# Patient Record
Sex: Female | Born: 1965 | Race: Black or African American | Hispanic: No | Marital: Single | Smoking: Light tobacco smoker
Health system: Southern US, Community
[De-identification: ages and names within clinical notes are randomized; demographics above are authoritative.]

## PROBLEM LIST (undated history)

## (undated) DIAGNOSIS — E162 Hypoglycemia, unspecified: Secondary | ICD-10-CM

## (undated) HISTORY — PX: ROTATOR CUFF REPAIR: SHX139

## (undated) HISTORY — PX: ABDOMINAL HYSTERECTOMY: SHX81

## (undated) HISTORY — PX: APPENDECTOMY: SHX54

---

## 2013-01-10 ENCOUNTER — Other Ambulatory Visit: Payer: Self-pay

## 2013-02-25 ENCOUNTER — Encounter (HOSPITAL_COMMUNITY): Payer: Self-pay | Admitting: Emergency Medicine

## 2013-02-25 ENCOUNTER — Emergency Department (INDEPENDENT_AMBULATORY_CARE_PROVIDER_SITE_OTHER)
Admission: EM | Admit: 2013-02-25 | Discharge: 2013-02-25 | Disposition: A | Discharge status: Home / Self Care | Payer: 59 | Source: Home / Self Care | Attending: Family Medicine | Admitting: Family Medicine

## 2013-02-25 DIAGNOSIS — A6000 Herpesviral infection of urogenital system, unspecified: Secondary | ICD-10-CM

## 2013-02-25 LAB — POCT URINALYSIS DIP (DEVICE)
Bilirubin Urine: NEGATIVE
Glucose, UA: NEGATIVE mg/dL
Nitrite: NEGATIVE
Specific Gravity, Urine: >=1.03 (ref 1.005–1.030)

## 2013-02-25 LAB — GLUCOSE, CAPILLARY: Glucose-Capillary: 106 mg/dL — ABNORMAL HIGH (ref 70–99)

## 2013-02-25 MED ORDER — VALACYCLOVIR HCL 500 MG PO TABS: 500.0000 mg | ORAL_TABLET | Freq: Two times a day (BID) | ORAL | Status: AC

## 2013-02-25 NOTE — ED Provider Notes (Signed)
Patricia Pierce is a 47 y.o. female who presents to Urgent Care today for genital herpes outbreak. Patient has pain around her clitoris consistent with prior episodes of genital herpes. She's been many months since her last outbreak. She denies any vaginal discharge dysuria nausea vomiting or diarrhea fevers or chills. She additionally notes mild dizziness. She describes a sensation of lightheadedness. This does not seem to related with standing or sitting. She has a history of hypoglycemia in the past and this is somewhat consistent with this. She does not take medications nor check her sugar. She would like a blood sugar checked today to make her nothing else is wrong. She denies any chest pains palpitations or shortness of breath. She denies any syncopal events or vertigo. Denies any weakness numbness loss of function or slurred speech or difficulty swallowing.    History reviewed. No pertinent past medical history. History  Substance Use Topics  . Smoking status: Not on file  . Smokeless tobacco: Not on file  . Alcohol Use: Not on file   ROS as above Medications reviewed. No current facility-administered medications for this encounter.   Current Outpatient Prescriptions  Medication Sig Dispense Refill  . valACYclovir (VALTREX) 500 MG tablet Take 1 tablet (500 mg total) by mouth 2 (two) times daily.  10 tablet  1    Exam:  BP 137/84  Pulse 76  Temp(Src) 98.7 F (37.1 C) (Oral)  Resp 18  SpO2 96%  Gen: Well NAD HEENT: EOMI,  MMM Lungs: CTABL Nl WOB Heart: RRR no MRG Abd: NABS, NT, ND Exts: Non edematous BL  LE, warm and well perfused.  GYN: Shallow ulcerations superior part of the vagina. Otherwise normal external genitalia exam without discharge. Neuro: Alert and oriented coordination intact balance is intact gait is normal. Strength is intact bilateral extremities  Results for orders placed during the hospital encounter of 02/25/13 (from the past 24 hour(s))  POCT URINALYSIS  DIP (DEVICE)     Status: Abnormal   Collection Time    02/25/13 11:20 AM      Result Value Range   Glucose, UA NEGATIVE  NEGATIVE mg/dL   Bilirubin Urine NEGATIVE  NEGATIVE   Ketones, ur NEGATIVE  NEGATIVE mg/dL   Specific Gravity, Urine >=1.030  1.005 - 1.030   Hgb urine dipstick MODERATE (*) NEGATIVE   pH 6.5  5.0 - 8.0   Protein, ur NEGATIVE  NEGATIVE mg/dL   Urobilinogen, UA 0.2  0.0 - 1.0 mg/dL   Nitrite NEGATIVE  NEGATIVE   Leukocytes, UA NEGATIVE  NEGATIVE  GLUCOSE, CAPILLARY     Status: Abnormal   Collection Time    02/25/13 11:43 AM      Result Value Range   Glucose-Capillary 106 (*) 70 - 99 mg/dL   No results found.  Assessment and Plan: 47 y.o. female with genital herpes outbreak. Will use Valtrex 500 mg twice daily for 5 days. Unclear cause of dizziness. Plan to followup with her primary care provider for this issue. She appears to be well currently. Discussed warning signs or symptoms. Please see discharge instructions. Patient expresses understanding.      Rodolph Bong, MD 02/25/13 [phone]

## 2013-02-25 NOTE — ED Notes (Signed)
C/o herpes outbreak.  Patient states she hypoglycemia and she has been dizzy a lot lately.

## 2013-10-24 ENCOUNTER — Emergency Department (INDEPENDENT_AMBULATORY_CARE_PROVIDER_SITE_OTHER)
Admission: EM | Admit: 2013-10-24 | Discharge: 2013-10-24 | Disposition: A | Discharge status: Home / Self Care | Payer: 59 | Source: Home / Self Care | Attending: Family Medicine | Admitting: Family Medicine

## 2013-10-24 ENCOUNTER — Other Ambulatory Visit (HOSPITAL_COMMUNITY)
Admission: RE | Admit: 2013-10-24 | Discharge: 2013-10-24 | Disposition: A | Discharge status: Home / Self Care | Payer: 59 | Source: Ambulatory Visit | Attending: Family Medicine | Admitting: Family Medicine

## 2013-10-24 ENCOUNTER — Encounter (HOSPITAL_COMMUNITY): Payer: Self-pay | Admitting: Emergency Medicine

## 2013-10-24 DIAGNOSIS — N760 Acute vaginitis: Secondary | ICD-10-CM

## 2013-10-24 DIAGNOSIS — Z113 Encounter for screening for infections with a predominantly sexual mode of transmission: Secondary | ICD-10-CM | POA: Insufficient documentation

## 2013-10-24 MED ORDER — METRONIDAZOLE 500 MG PO TABS: 500.0000 mg | ORAL_TABLET | Freq: Two times a day (BID) | ORAL | Status: DC

## 2013-10-24 MED ORDER — FLUCONAZOLE 150 MG PO TABS: 150.0000 mg | ORAL_TABLET | Freq: Once | ORAL | Status: DC

## 2013-10-24 NOTE — Discharge Instructions (Signed)
Thank you for coming in today. °If your belly pain worsens, or you have high fever, bad vomiting, blood in your stool or black tarry stool go to the Emergency Room.  ° °Bacterial Vaginosis °Bacterial vaginosis is a vaginal infection that occurs when the normal balance of bacteria in the vagina is disrupted. It results from an overgrowth of certain bacteria. This is the most common vaginal infection in women of childbearing age. Treatment is important to prevent complications, especially in pregnant women, as it can cause a premature delivery. °CAUSES  °Bacterial vaginosis is caused by an increase in harmful bacteria that are normally present in smaller amounts in the vagina. Several different kinds of bacteria can cause bacterial vaginosis. However, the reason that the condition develops is not fully understood. °RISK FACTORS °Certain activities or behaviors can put you at an increased risk of developing bacterial vaginosis, including: °· Having a new sex partner or multiple sex partners. °· Douching. °· Using an intrauterine device (IUD) for contraception. °Women do not get bacterial vaginosis from toilet seats, bedding, swimming pools, or contact with objects around them. °SIGNS AND SYMPTOMS  °Some women with bacterial vaginosis have no signs or symptoms. Common symptoms include: °· Grey vaginal discharge. °· A fishlike odor with discharge, especially after sexual intercourse. °· Itching or burning of the vagina and vulva. °· Burning or pain with urination. °DIAGNOSIS  °Your health care provider will take a medical history and examine the vagina for signs of bacterial vaginosis. A sample of vaginal fluid may be taken. Your health care provider will look at this sample under a microscope to check for bacteria and abnormal cells. A vaginal pH test may also be done.  °TREATMENT  °Bacterial vaginosis may be treated with antibiotic medicines. These may be given in the form of a pill or a vaginal cream. A second round  of antibiotics may be prescribed if the condition comes back after treatment.  °HOME CARE INSTRUCTIONS  °· Only take over-the-counter or prescription medicines as directed by your health care provider. °· If antibiotic medicine was prescribed, take it as directed. Make sure you finish it even if you start to feel better. °· Do not have sex until treatment is completed. °· Tell all sexual partners that you have a vaginal infection. They should see their health care provider and be treated if they have problems, such as a mild rash or itching. °· Practice safe sex by using condoms and only having one sex partner. °SEEK MEDICAL CARE IF:  °· Your symptoms are not improving after 3 days of treatment. °· You have increased discharge or pain. °· You have a fever. °MAKE SURE YOU:  °· Understand these instructions. °· Will watch your condition. °· Will get help right away if you are not doing well or get worse. °FOR MORE INFORMATION  °Centers for Disease Control and Prevention, Division of STD Prevention: www.cdc.gov/std °American Sexual Health Association (ASHA): www.ashastd.org  °Document Released: 05/17/2005 Document Revised: 03/07/2013 Document Reviewed: 12/27/2012 °ExitCare® Patient Information ©2014 [employer]. ° °

## 2013-10-24 NOTE — ED Notes (Signed)
C/o bacteria vaginosis due to discharge States she did douche recently Denies any urinary problems or abd problems

## 2013-10-25 NOTE — ED Provider Notes (Signed)
Patricia Pierce is a 48 y.o. female who presents to Urgent Care today for vaginal discharge consistent with history of bacterial vaginosis. Symptoms present for a few days. She recently douched and thinks that may have something to do with it. No fevers chills nausea vomiting or diarrhea. Surgical history significant for hysterectomy. No abdominal pain nausea vomiting or diarrhea. No medications tried yet.   History reviewed. No pertinent past medical history. History  Substance Use Topics  . Smoking status: Not on file  . Smokeless tobacco: Not on file  . Alcohol Use: Not on file   ROS as above Medications: No current facility-administered medications for this encounter.   Current Outpatient Prescriptions  Medication Sig Dispense Refill  . fluconazole (DIFLUCAN) 150 MG tablet Take 1 tablet (150 mg total) by mouth once.  1 tablet  1  . metroNIDAZOLE (FLAGYL) 500 MG tablet Take 1 tablet (500 mg total) by mouth 2 (two) times daily.  14 tablet  1    Exam:  BP 113/87  Pulse 64  Temp(Src) 98.2 F (36.8 C) (Oral)  Resp 18  SpO2 100% Gen: Well NAD GYN: Normal external genitalia. Vaginal canal with thick clumpy white discharge. Vaginal cuff is normal-appearing.  No results found for this or any previous visit (from the past 24 hour(s)). No results found.  Assessment and Plan: 48 y.o. female with vaginitis. Cytology pending. EmpericTreatment with fluconazole and metronidazole.  Discussed warning signs or symptoms. Please see discharge instructions. Patient expresses understanding.    Rodolph Bong, MD 10/25/13 [phone]

## 2013-10-30 ENCOUNTER — Emergency Department (INDEPENDENT_AMBULATORY_CARE_PROVIDER_SITE_OTHER)
Admission: EM | Admit: 2013-10-30 | Discharge: 2013-10-30 | Disposition: A | Discharge status: Home / Self Care | Payer: 59 | Source: Home / Self Care

## 2013-10-30 ENCOUNTER — Encounter (HOSPITAL_COMMUNITY): Payer: Self-pay | Admitting: Emergency Medicine

## 2013-10-30 ENCOUNTER — Emergency Department (INDEPENDENT_AMBULATORY_CARE_PROVIDER_SITE_OTHER): Payer: 59

## 2013-10-30 DIAGNOSIS — S8990XA Unspecified injury of unspecified lower leg, initial encounter: Secondary | ICD-10-CM

## 2013-10-30 DIAGNOSIS — Y92009 Unspecified place in unspecified non-institutional (private) residence as the place of occurrence of the external cause: Secondary | ICD-10-CM

## 2013-10-30 DIAGNOSIS — S99919A Unspecified injury of unspecified ankle, initial encounter: Secondary | ICD-10-CM

## 2013-10-30 DIAGNOSIS — S99929A Unspecified injury of unspecified foot, initial encounter: Secondary | ICD-10-CM

## 2013-10-30 NOTE — ED Notes (Signed)
Patient states got up this am and was rushing around and [facility] her left little toe. Toe is now swollen and bruised and pain is radiating Up her foot

## 2013-10-30 NOTE — Discharge Instructions (Signed)
You jammed your 5th toe but there is no evidence of a fracture Please let pain be your guide Start 600mg  of ibuprofen every 6 hours Please apply ice off adn on for the next 24-48 hours then heat.  Please follow up with your regular doctor or [place] as needed

## 2013-10-30 NOTE — ED Provider Notes (Signed)
CSN: [id number]     Arrival date & time 10/30/13  1728 History   None    Chief Complaint  Patient presents with  . Toe Injury   (Consider location/radiation/quality/duration/timing/severity/associated sxs/prior Treatment) HPI  L 5th toe injured this morning when trying to get to bathroom while getting ready. Immediately painful. Radiates to ankle. Sharp in nature. Swelling of the toe present. Better w/ rest and worse w/ ambulation. Ice w/ some improvement. Has not taken any oral medications. No change since initial injury. Able to move toe and sensaton intact.    History reviewed. No pertinent past medical history. History reviewed. No pertinent past surgical history. Family History  Problem Relation Age of Onset  . Hypertension Mother   . Hypertension Father    History  Substance Use Topics  . Smoking status: Not on file  . Smokeless tobacco: Not on file  . Alcohol Use: Not on file   OB History   Grav Para Term Preterm Abortions TAB SAB Ect Mult Living                 Review of Systems  Constitutional: Positive for activity change.  Musculoskeletal: Positive for arthralgias and joint swelling.  All other systems reviewed and are negative.   Allergies  Codeine  Home Medications   Prior to Admission medications   Medication Sig Start Date End Date Taking? Authorizing Provider  fluconazole (DIFLUCAN) 150 MG tablet Take 1 tablet (150 mg total) by mouth once. 10/24/13   Rodolph Bong, MD  metroNIDAZOLE (FLAGYL) 500 MG tablet Take 1 tablet (500 mg total) by mouth 2 (two) times daily. 10/24/13   Rodolph Bong, MD   BP 110/77  Pulse 81  Temp(Src) 98.2 F (36.8 C) (Oral)  Resp 16  SpO2 98% Physical Exam  Constitutional: She appears well-developed and well-nourished. No distress.  HENT:  Head: Normocephalic and atraumatic.  Eyes: EOM are normal. Pupils are equal, round, and reactive to light.  Neck: Normal range of motion.  Cardiovascular: Normal rate, regular rhythm,  normal heart sounds and intact distal pulses.  Exam reveals no gallop and no friction rub.   No murmur heard. Pulmonary/Chest: Effort normal and breath sounds normal. No respiratory distress. She has no wheezes. She has no rales. She exhibits no tenderness.  Abdominal: Soft. She exhibits no distension.  Musculoskeletal:  L 5th toe swollen and ttp. ttp extends proximally to the distal 5th metatarsal. ROM limited by swelling.   Neurological: She is alert. No cranial nerve deficit.  Skin: Skin is warm and dry. She is not diaphoretic.  Psychiatric: She has a normal mood and affect. Her behavior is normal. Judgment and thought content normal.    ED Course  Procedures (including critical care time) Labs Review Labs Reviewed - No data to display  Imaging Review Dg Foot Complete Left  10/30/2013   CLINICAL DATA:  Fifth toe injury with bruising and pain.  EXAM: LEFT FOOT - COMPLETE 3+ VIEW  COMPARISON:  None.  FINDINGS: On the AP view, lucency within the lateral aspects of the fifth middle and distal phalanges is likely artifactual. No definite acute fracture.  IMPRESSION: No definite acute fracture.   Electronically Signed   By: Leanna Battles M.D.   On: 10/30/2013 18:34     MDM   1. Toe injury    L 5th toe injury w/o fracture. Postop shoe if pt desires for stability. Buddy tape as tolerated.  Ice, ibuprofen 600mg  every 6 hours F/u PRN  Precautions given adn all questions answered  Shelly Flattenavid Merrell, MD Family Medicine PGY-3 10/30/2013, 7:10 PM     Ozella Rocksavid J Merrell, MD 10/30/13 1910

## 2013-10-30 NOTE — ED Provider Notes (Signed)
Medical screening examination/treatment/procedure(s) were performed by a resident physician and as supervising physician I was immediately available for consultation/collaboration.  Leslee Home, M.D.  Reuben Likes, MD 10/30/13 2222

## 2013-11-05 NOTE — ED Notes (Signed)
GC/Chlamydia neg., Affirm: Candida and Gardnerella pos., Trich neg., Pt. adequately treated with Diflucan and Flagyl. Desiree Lucy [facility] 11/05/2013

## 2014-04-07 ENCOUNTER — Ambulatory Visit (HOSPITAL_COMMUNITY): Payer: 59 | Attending: Family Medicine

## 2014-04-07 ENCOUNTER — Emergency Department (HOSPITAL_COMMUNITY)
Admission: EM | Admit: 2014-04-07 | Discharge: 2014-04-07 | Disposition: A | Discharge status: Home / Self Care | Payer: 59 | Source: Home / Self Care | Attending: Family Medicine | Admitting: Family Medicine

## 2014-04-07 ENCOUNTER — Encounter (HOSPITAL_COMMUNITY): Payer: Self-pay | Admitting: *Deleted

## 2014-04-07 DIAGNOSIS — R509 Fever, unspecified: Secondary | ICD-10-CM | POA: Diagnosis not present

## 2014-04-07 DIAGNOSIS — R079 Chest pain, unspecified: Secondary | ICD-10-CM | POA: Diagnosis not present

## 2014-04-07 DIAGNOSIS — J40 Bronchitis, not specified as acute or chronic: Principal | ICD-10-CM

## 2014-04-07 DIAGNOSIS — J410 Simple chronic bronchitis: Secondary | ICD-10-CM

## 2014-04-07 DIAGNOSIS — Z720 Tobacco use: Secondary | ICD-10-CM

## 2014-04-07 DIAGNOSIS — R05 Cough: Secondary | ICD-10-CM | POA: Diagnosis present

## 2014-04-07 MED ORDER — DEXTROMETHORPHAN POLISTIREX 30 MG/5ML PO LQCR: 60.0000 mg | Freq: Two times a day (BID) | ORAL | Status: DC

## 2014-04-07 MED ORDER — MINOCYCLINE HCL 100 MG PO CAPS: 100.0000 mg | ORAL_CAPSULE | Freq: Two times a day (BID) | ORAL | Status: DC

## 2014-04-07 NOTE — Discharge Instructions (Signed)
Take all of medicine, drink lots of fluids, no more smoking, see your doctor if further problems  °

## 2014-04-07 NOTE — ED Provider Notes (Signed)
CSN: [id number]     Arrival date & time 04/07/14  1128 History   First MD Initiated Contact with Patient 04/07/14 1133     Chief Complaint  Patient presents with  . Cough   (Consider location/radiation/quality/duration/timing/severity/associated sxs/prior Treatment) Patient is a 48 y.o. female presenting with cough. The history is provided by the patient.  Cough Cough characteristics:  Non-productive and dry Severity:  Moderate Onset quality:  Gradual Duration:  6 days Chronicity:  New Smoker: yes   Context: upper respiratory infection   Relieved by:  None tried Worsened by:  Nothing tried Associated symptoms: chest pain, fever, rhinorrhea and shortness of breath   Associated symptoms: no wheezing     History reviewed. No pertinent past medical history. History reviewed. No pertinent past surgical history. Family History  Problem Relation Age of Onset  . Hypertension Mother   . Hypertension Father    History  Substance Use Topics  . Smoking status: Not on file  . Smokeless tobacco: Not on file  . Alcohol Use: Not on file   OB History    No data available     Review of Systems  Constitutional: Positive for fever and appetite change.  HENT: Positive for rhinorrhea.   Respiratory: Positive for cough, chest tightness and shortness of breath. Negative for wheezing.   Cardiovascular: Positive for chest pain. Negative for palpitations and leg swelling.  Gastrointestinal: Negative.     Allergies  Codeine  Home Medications   Prior to Admission medications   Medication Sig Start Date End Date Taking? Authorizing Provider  dextromethorphan (DELSYM) 30 MG/5ML liquid Take 10 mLs (60 mg total) by mouth 2 (two) times daily. Prn cough 04/07/14   Linna HoffJames D Malicia Blasdel, MD  fluconazole (DIFLUCAN) 150 MG tablet Take 1 tablet (150 mg total) by mouth once. 10/24/13   Rodolph BongEvan S Corey, MD  metroNIDAZOLE (FLAGYL) 500 MG tablet Take 1 tablet (500 mg total) by mouth 2 (two) times daily. 10/24/13    Rodolph BongEvan S Corey, MD  minocycline (MINOCIN,DYNACIN) 100 MG capsule Take 1 capsule (100 mg total) by mouth 2 (two) times daily. 04/07/14   Linna HoffJames D Lawarence Meek, MD   There were no vitals taken for this visit. Physical Exam  Constitutional: She is oriented to person, place, and time. She appears well-developed and well-nourished. No distress.  HENT:  Right Ear: External ear normal.  Left Ear: External ear normal.  Nose: Nose normal.  Mouth/Throat: Oropharynx is clear and moist.  Eyes: Pupils are equal, round, and reactive to light.  Neck: Normal range of motion. Neck supple.  Cardiovascular: Normal heart sounds and intact distal pulses.   Pulmonary/Chest: Effort normal. She has decreased breath sounds in the left lower field. She has no wheezes. She has rhonchi. She has no rales.  Lymphadenopathy:    She has no cervical adenopathy.  Neurological: She is alert and oriented to person, place, and time.  Skin: Skin is warm and dry.  Nursing note and vitals reviewed.   ED Course  Procedures (including critical care time) Labs Review Labs Reviewed - No data to display  Imaging Review Dg Chest 2 View  04/07/2014   CLINICAL DATA:  48 year old female with 1 week history of cough, chest pain, chills and fever  EXAM: CHEST  2 VIEW  COMPARISON:  None.  FINDINGS: The lungs are clear and negative for focal airspace consolidation, pulmonary edema or suspicious pulmonary nodule. No pleural effusion or pneumothorax. Cardiac and mediastinal contours are within normal limits. No acute  fracture or lytic or blastic osseous lesions. The visualized upper abdominal bowel gas pattern is unremarkable.  IMPRESSION: No active cardiopulmonary disease.   Electronically Signed   By: Malachy MoanHeath  McCullough M.D.   On: 04/07/2014 12:50   X-rays reviewed and report per radiologist.   MDM   1. Bronchitis due to tobacco use        Linna HoffJames D Imaan Padgett, MD 04/07/14 1317

## 2014-04-07 NOTE — ED Notes (Signed)
Pt    Reports   Symptoms    Of  Cough   /  Congested           With        Rales  l  Side      Symptoms  For    About  1  Week  Worse     yest           Pt  Feels  Weak  As  Well

## 2014-07-20 ENCOUNTER — Encounter (HOSPITAL_COMMUNITY): Payer: Self-pay | Admitting: Emergency Medicine

## 2014-07-20 ENCOUNTER — Other Ambulatory Visit (HOSPITAL_COMMUNITY)
Admission: RE | Admit: 2014-07-20 | Discharge: 2014-07-20 | Disposition: A | Discharge status: Home / Self Care | Payer: 59 | Source: Ambulatory Visit | Attending: Family Medicine | Admitting: Family Medicine

## 2014-07-20 ENCOUNTER — Emergency Department (INDEPENDENT_AMBULATORY_CARE_PROVIDER_SITE_OTHER)
Admission: EM | Admit: 2014-07-20 | Discharge: 2014-07-20 | Disposition: A | Discharge status: Home / Self Care | Payer: 59 | Source: Home / Self Care | Attending: Family Medicine | Admitting: Family Medicine

## 2014-07-20 DIAGNOSIS — Z113 Encounter for screening for infections with a predominantly sexual mode of transmission: Secondary | ICD-10-CM | POA: Insufficient documentation

## 2014-07-20 DIAGNOSIS — N390 Urinary tract infection, site not specified: Secondary | ICD-10-CM | POA: Diagnosis not present

## 2014-07-20 DIAGNOSIS — N760 Acute vaginitis: Secondary | ICD-10-CM | POA: Diagnosis present

## 2014-07-20 LAB — POCT URINALYSIS DIP (DEVICE)
Bilirubin Urine: NEGATIVE
Glucose, UA: NEGATIVE mg/dL
Ketones, ur: NEGATIVE mg/dL
Nitrite: POSITIVE — AB
Protein, ur: 30 mg/dL — AB
Specific Gravity, Urine: 1.02 (ref 1.005–1.030)
Urobilinogen, UA: 0.2 mg/dL (ref 0.0–1.0)
pH: 7.5 (ref 5.0–8.0)

## 2014-07-20 MED ORDER — FLUCONAZOLE 150 MG PO TABS: 150.0000 mg | ORAL_TABLET | Freq: Once | ORAL | Status: DC

## 2014-07-20 MED ORDER — CEFUROXIME AXETIL 250 MG PO TABS: 250.0000 mg | ORAL_TABLET | Freq: Two times a day (BID) | ORAL | Status: DC

## 2014-07-20 MED ORDER — FLUCONAZOLE 150 MG PO TABS: ORAL_TABLET | ORAL | Status: DC

## 2014-07-20 MED ORDER — METRONIDAZOLE 500 MG PO TABS: 500.0000 mg | ORAL_TABLET | Freq: Two times a day (BID) | ORAL | Status: DC

## 2014-07-20 NOTE — ED Provider Notes (Signed)
CSN: [id number]     Arrival date & time 07/20/14  1844 History   First MD Initiated Contact with Patient 07/20/14 1918     Chief Complaint  Patient presents with  . Vaginitis   (Consider location/radiation/quality/duration/timing/severity/associated sxs/prior Treatment) HPI         49 year old female presents complaining of possible bacterial vaginosis. She has vaginal discharge and itching. This started a few days ago. She has had similar symptoms in the past bacterial vaginosis. Denies any malodorous discharge. Denies any risk for STDs. She had a mild pang of abdominal pain this morning but that has resolved  History reviewed. No pertinent past medical history. History reviewed. No pertinent past surgical history. Family History  Problem Relation Age of Onset  . Hypertension Mother   . Hypertension Father    History  Substance Use Topics  . Smoking status: Never Smoker   . Smokeless tobacco: Not on file  . Alcohol Use: Yes   OB History    No data available     Review of Systems  Gastrointestinal: Positive for abdominal pain.  Genitourinary: Positive for vaginal discharge. Negative for dysuria, urgency, frequency, hematuria, flank pain, vaginal bleeding and vaginal pain.  All other systems reviewed and are negative.   Allergies  Codeine  Home Medications   Prior to Admission medications   Medication Sig Start Date End Date Taking? Authorizing Provider  cefUROXime (CEFTIN) 250 MG tablet Take 1 tablet (250 mg total) by mouth 2 (two) times daily with a meal. 07/20/14   Graylon Good, PA-C  dextromethorphan (DELSYM) 30 MG/5ML liquid Take 10 mLs (60 mg total) by mouth 2 (two) times daily. Prn cough 04/07/14   Linna Hoff, MD  fluconazole (DIFLUCAN) 150 MG tablet Take 1 tablet now and the second tablet after finishing your antibiotics 07/20/14   Graylon Good, PA-C  metroNIDAZOLE (FLAGYL) 500 MG tablet Take 1 tablet (500 mg total) by mouth 2 (two) times daily. 07/20/14    Graylon Good, PA-C  minocycline (MINOCIN,DYNACIN) 100 MG capsule Take 1 capsule (100 mg total) by mouth 2 (two) times daily. 04/07/14   Linna Hoff, MD   BP 134/85 mmHg  Pulse 77  Temp(Src) 98.4 F (36.9 C) (Oral)  Resp 18  SpO2 100% Physical Exam  Constitutional: She is oriented to person, place, and time. Vital signs are normal. She appears well-developed and well-nourished. No distress.  HENT:  Head: Normocephalic and atraumatic.  Pulmonary/Chest: Effort normal. No respiratory distress.  Abdominal: Soft. She exhibits no mass. There is no tenderness. There is no rebound and no guarding.  Genitourinary: Vaginal discharge (a small amount of white clumpy discharge) found.  Neurological: She is alert and oriented to person, place, and time. She has normal strength. Coordination normal.  Skin: Skin is warm and dry. No rash noted. She is not diaphoretic.  Psychiatric: She has a normal mood and affect. Judgment normal.  Nursing note and vitals reviewed.   ED Course  Procedures (including critical care time) Labs Review Labs Reviewed  POCT URINALYSIS DIP (DEVICE) - Abnormal; Notable for the following:    Hgb urine dipstick SMALL (*)    Protein, ur 30 (*)    Nitrite POSITIVE (*)    Leukocytes, UA LARGE (*)    All other components within normal limits  URINE CULTURE  CERVICOVAGINAL ANCILLARY ONLY    Imaging Review No results found.   MDM   1. Vaginitis   2. UTI (lower urinary tract infection)  This has the appearance of a yeast infection. Her urine is positive for large leukocytes, nitrites, blood, and protein, consistent with a urinary tract infection. Treat for UTI as well as for a yeast infection. Wet prep and vaginal cytology was sent as well as a urine culture. Follow-up when necessary   Meds ordered this encounter  Medications  . metroNIDAZOLE (FLAGYL) 500 MG tablet    Sig: Take 1 tablet (500 mg total) by mouth 2 (two) times daily.    Dispense:  14 tablet     Refill:  1    Order Specific Question:  Supervising Provider    Answer:  Linna HoffKINDL, JAMES D [phone][[id number]]  . DISCONTD: fluconazole (DIFLUCAN) 150 MG tablet    Sig: Take 1 tablet (150 mg total) by mouth once.    Dispense:  1 tablet    Refill:  1    Order Specific Question:  Supervising Provider    Answer:  Bradd CanaryKINDL, JAMES D [id number]]  . cefUROXime (CEFTIN) 250 MG tablet    Sig: Take 1 tablet (250 mg total) by mouth 2 (two) times daily with a meal.    Dispense:  10 tablet    Refill:  0    Order Specific Question:  Supervising Provider    Answer:  Linna HoffKINDL, JAMES D [phone][[id number]]  . fluconazole (DIFLUCAN) 150 MG tablet    Sig: Take 1 tablet now and the second tablet after finishing your antibiotics    Dispense:  2 tablet    Refill:  0    Order Specific Question:  Supervising Provider    Answer:  Bradd CanaryKINDL, JAMES D [[id number]]       Graylon GoodZachary H Tarek Cravens, PA-C 07/20/14 [phone]

## 2014-07-20 NOTE — ED Notes (Signed)
Pt states that she has had vaginal itching for 2 days

## 2014-07-20 NOTE — Discharge Instructions (Signed)

## 2014-07-22 LAB — CERVICOVAGINAL ANCILLARY ONLY
CHLAMYDIA, DNA PROBE: NEGATIVE
Neisseria Gonorrhea: NEGATIVE

## 2014-07-23 LAB — URINE CULTURE

## 2014-07-23 LAB — CERVICOVAGINAL ANCILLARY ONLY
Wet Prep (BD Affirm): NEGATIVE
Wet Prep (BD Affirm): POSITIVE — AB
Wet Prep (BD Affirm): POSITIVE — AB

## 2014-07-23 NOTE — ED Notes (Addendum)
Urine culture: >100,000 colonies E. Coli.  Pt. adequately treated with Ceftin. Patricia Pierce, Patricia Pierce 07/23/2014 GC/Chlamydia neg., Affirm: Candida and Gardnerella pos., Trich neg. Pt. adequately treated with Diflucan and Flagyl. Patricia Pierce, Patricia Pierce 07/30/2014

## 2014-09-03 ENCOUNTER — Emergency Department (HOSPITAL_COMMUNITY)
Admission: EM | Admit: 2014-09-03 | Discharge: 2014-09-03 | Disposition: A | Discharge status: Home / Self Care | Payer: 59 | Source: Home / Self Care | Attending: Family Medicine | Admitting: Family Medicine

## 2014-09-03 ENCOUNTER — Encounter (HOSPITAL_COMMUNITY): Payer: Self-pay | Admitting: Emergency Medicine

## 2014-09-03 DIAGNOSIS — H6011 Cellulitis of right external ear: Secondary | ICD-10-CM

## 2014-09-03 HISTORY — DX: Hypoglycemia, unspecified: E16.2

## 2014-09-03 MED ORDER — NEOMYCIN-POLYMYXIN-HC 3.5-10000-1 OT SUSP: 4.0000 [drp] | Freq: Three times a day (TID) | OTIC | Status: DC

## 2014-09-03 NOTE — ED Notes (Signed)
Pt started with some pain in her right ear about three days ago.  Yesterday she stated "it felt full and muffled".  She used a Q-Tip to clean it and when she did she had some significant drainage with blood.  Pt states the ear is still painful today.  She did note some tenderness in her right neck the first day, but it has since gone away.

## 2014-09-03 NOTE — Discharge Instructions (Signed)
Draining Ear Ear wax, pus, blood and other fluids are examples of the different types of drainage from ears. Drops or cream may be needed to lessen the itching which may occur with ear drainage. CAUSES   Skin irritations in the ear.  Ear infection.  Swimmer's ear.  Ruptured eardrum.  Foreign object in the ear canal.  Sudden pressure changes.  Head injury. HOME CARE INSTRUCTIONS   Only take over-the-counter or prescription medicines for pain, fever, or discomfort as directed by your caregiver.  Do not rub the ear canal with cotton-tipped swabs.  Do not swim until your caregiver says it is okay.  Before you take a shower, cover a cotton ball with petroleum jelly to keep water out.  Limit exposure to smoke. Secondhand smoke can increase the chance for ear infections.  Keep up with immunizations.  Wash your hands well.  Keep all follow-up appointments to examine the ear and evaluate hearing. SEEK MEDICAL CARE IF:   You have increased drainage.  You have ear pain, a fever, or drainage that is not getting better after 48 hours of antibiotics.  You are unusually tired. SEEK IMMEDIATE MEDICAL CARE IF:  You have severe ear pain or headache.  The patient is older than 3 months with a rectal or oral temperature of 102 F (38.9 C) or higher.  The patient is 1003 months old or younger with a rectal temperature of 100.4 F (38 C) or higher.  You vomit.  You feel dizzy.  You have a seizure.  You have new hearing loss. MAKE SURE YOU:   Understand these instructions.  Will watch your condition.  Will get help right away if you are not doing well or get worse. Document Released: 05/17/2005 Document Revised: 08/09/2011 Document Reviewed: 03/20/2009 [facility] Patient Information 2015 [place], [place]. This information is not intended to replace advice given to you by your health care provider. Make sure you discuss any questions you have with your health care provider.  Ear  Drops You have been diagnosed with a condition requiring you to put drops of medicine into your outer ear. HOME CARE INSTRUCTIONS   Put drops in the affected ear as instructed. After putting the drops in, you will need to lie down with the affected ear facing up for ten minutes so the drops will remain in the ear canal and run down and fill the canal. Continue using the ear drops for as long as directed by your health care provider.  Prior to getting up, put a cotton ball gently in your ear canal. Leave enough of the cotton ball out so it can be easily removed. Do not attempt to push this down into the canal with a cotton-tipped swab or other instrument.  Do not irrigate or wash out your ears if you have had a perforated eardrum or mastoid surgery, or unless instructed to do so by your health care provider.  Keep appointments with your health care provider as instructed.  Finish all medicine, or use for the length of time prescribed by your health care provider. Continue the drops even if your problem seems to be doing well after a couple days, or continue as instructed. SEEK MEDICAL CARE IF:  You become worse or develop increasing pain.  You notice any unusual drainage from your ear (particularly if the drainage has a bad smell).  You develop hearing difficulties.  You experience a serious form of dizziness in which you feel as if the room is spinning, and you  feel nauseated (vertigo).  The outside of your ear becomes red or swollen or both. This may be a sign of an allergic reaction. MAKE SURE YOU:   Understand these instructions.  Will watch your condition.  Will get help right away if you are not doing well or get worse. Document Released: 05/11/2001 Document Revised: 05/22/2013 Document Reviewed: 12/12/2012 [facility] Patient Information 2015 [place], [place]. This information is not intended to replace advice given to you by your health care provider. Make sure you discuss any  questions you have with your health care provider.

## 2014-09-06 NOTE — ED Provider Notes (Signed)
CSN: [id number]     Arrival date & time 09/03/14  1842 History   First MD Initiated Contact with Patient 09/03/14 2113     Chief Complaint  Patient presents with  . Otalgia   (Consider location/radiation/quality/duration/timing/severity/associated sxs/prior Treatment) HPI Comments: Patient reports 3 days of a right sided earache. States she has had a small amount of yellow drainage from right ear canal over past 24 hours. States ear feels plugged. Denies fever and reports herself to be otherwise healthy.  PCP: none No hearing aids Works at [employer]   Patient is a 49 y.o. female presenting with ear pain. The history is provided by the patient.  Otalgia   Past Medical History  Diagnosis Date  . Hypoglycemia    Past Surgical History  Procedure Laterality Date  . Appendectomy    . Rotator cuff repair    . Abdominal hysterectomy     Family History  Problem Relation Age of Onset  . Hypertension Mother   . Hypertension Father    History  Substance Use Topics  . Smoking status: Never Smoker   . Smokeless tobacco: Never Used  . Alcohol Use: Yes     Comment: occasional   OB History    No data available     Review of Systems  HENT: Positive for ear pain.   All other systems reviewed and are negative.   Allergies  Codeine  Home Medications   Prior to Admission medications   Medication Sig Start Date End Date Taking? Authorizing Provider  cefUROXime (CEFTIN) 250 MG tablet Take 1 tablet (250 mg total) by mouth 2 (two) times daily with a meal. 07/20/14   Graylon Good, PA-C  dextromethorphan (DELSYM) 30 MG/5ML liquid Take 10 mLs (60 mg total) by mouth 2 (two) times daily. Prn cough 04/07/14   Linna Hoff, MD  fluconazole (DIFLUCAN) 150 MG tablet Take 1 tablet now and the second tablet after finishing your antibiotics 07/20/14   Graylon Good, PA-C  minocycline (MINOCIN,DYNACIN) 100 MG capsule Take 1 capsule (100 mg total) by mouth 2 (two) times daily. 04/07/14   Linna Hoff,  MD  neomycin-polymyxin-hydrocortisone (CORTISPORIN) 3.5-10000-1 otic suspension Place 4 drops into the right ear 3 (three) times daily. X 7 days 09/03/14   Jess Barters H Taylia Berber, PA   BP 130/92 mmHg  Pulse 72  Temp(Src) 98.2 F (36.8 C) (Oral)  Resp 14  SpO2 100% Physical Exam  Constitutional: She is oriented to person, place, and time. She appears well-developed and well-nourished. No distress.  HENT:  Head: Normocephalic and atraumatic.  Right Ear: Hearing and tympanic membrane normal.  Left Ear: Hearing, tympanic membrane, external ear and ear canal normal.  Ears:  Nose: Nose normal.  Mouth/Throat: Uvula is midline, oropharynx is clear and moist and mucous membranes are normal.  Eyes: Conjunctivae are normal.  Neck: Normal range of motion. Neck supple.  Cardiovascular: Normal rate, regular rhythm and normal heart sounds.   Pulmonary/Chest: Effort normal and breath sounds normal.  Musculoskeletal: Normal range of motion. She exhibits no edema.  Lymphadenopathy:    She has no cervical adenopathy.  Neurological: She is alert and oriented to person, place, and time.  Skin: Skin is warm and dry.  Psychiatric: She has a normal mood and affect. Her behavior is normal.  Nursing note and vitals reviewed.   ED Course  Procedures (including critical care time) Labs Review Labs Reviewed - No data to display  Imaging Review No results found.  MDM   1. Cellulitis of ear canal, right   Gently unroofed pustule with ear curette and drained small amount of yellow purulent material and patient reports reduction in pain and sense of pressure. Will treat with Cortisporin drops along with ibuprofen or tylenol as directed on packaging for pain and advise follow up if no improvement.     Ria ClockJennifer Lee H Zion Ta, GeorgiaPA 09/06/14 [phone]

## 2015-01-25 ENCOUNTER — Emergency Department (INDEPENDENT_AMBULATORY_CARE_PROVIDER_SITE_OTHER): Payer: 59

## 2015-01-25 ENCOUNTER — Emergency Department (INDEPENDENT_AMBULATORY_CARE_PROVIDER_SITE_OTHER)
Admission: EM | Admit: 2015-01-25 | Discharge: 2015-01-25 | Disposition: A | Discharge status: Home / Self Care | Payer: 59 | Source: Home / Self Care

## 2015-01-25 ENCOUNTER — Encounter (HOSPITAL_COMMUNITY): Payer: Self-pay | Admitting: Emergency Medicine

## 2015-01-25 DIAGNOSIS — R030 Elevated blood-pressure reading, without diagnosis of hypertension: Secondary | ICD-10-CM

## 2015-01-25 DIAGNOSIS — J40 Bronchitis, not specified as acute or chronic: Secondary | ICD-10-CM | POA: Diagnosis not present

## 2015-01-25 DIAGNOSIS — IMO0001 Reserved for inherently not codable concepts without codable children: Secondary | ICD-10-CM

## 2015-01-25 MED ORDER — AZITHROMYCIN 250 MG PO TABS: 250.0000 mg | ORAL_TABLET | Freq: Every day | ORAL | Status: DC

## 2015-01-25 NOTE — Discharge Instructions (Signed)
It was nice seeing you today. I am sorry you have been coughing for some time now. Your chest xray looks good. If cough persist please start the antibiotic prescribed. I continue OTC cough medication. Go to the ED if worsening.

## 2015-01-25 NOTE — ED Provider Notes (Signed)
CSN: [id number]     Arrival date & time 01/25/15  1302 History   None    Chief Complaint  Patient presents with  . URI   (Consider location/radiation/quality/duration/timing/severity/associated sxs/prior Treatment) Patient is a 49 y.o. female presenting with URI and hypertension. The history is provided by the patient.  URI Presenting symptoms: congestion, cough and fatigue   Presenting symptoms: no ear pain, no fever and no sore throat   Presenting symptoms comment:  Started coughing 1 wk ago Severity:  Moderate Onset quality:  Gradual Timing:  Constant Progression:  Worsening Chronicity:  New Relieved by:  OTC medications (Teraflu,mucinex) Worsened by:  Certain positions (tobacco smoke makes it worse as well. Her fiancee smokes around her) Associated symptoms: headaches, myalgias and wheezing   Associated symptoms: no neck pain and no sneezing   Headaches:    Severity:  Mild   Onset quality:  Gradual   Duration:  2 days   Timing:  Intermittent   Progression:  Waxing and waning Risk factors: no recent illness, no recent travel and no sick contacts   Risk factors comment:  Fiance was also sick before her Hypertension This is a new (BP elevated today. She denies hx of HTN,not on antihypertensive) problem. The current episode started 1 to 2 hours ago. The problem occurs rarely. Associated symptoms include headaches. Pertinent negatives include no shortness of breath. Nothing aggravates the symptoms. Nothing relieves the symptoms. She has tried nothing for the symptoms.    Past Medical History  Diagnosis Date  . Hypoglycemia    Past Surgical History  Procedure Laterality Date  . Appendectomy    . Rotator cuff repair    . Abdominal hysterectomy     Family History  Problem Relation Age of Onset  . Hypertension Mother   . Hypertension Father    Social History  Substance Use Topics  . Smoking status: Never Smoker   . Smokeless tobacco: Never Used  . Alcohol Use: Yes   Comment: occasional   OB History    No data available     Review of Systems  Constitutional: Positive for fatigue. Negative for fever.  HENT: Positive for congestion. Negative for ear pain, sneezing and sore throat.   Respiratory: Positive for cough and wheezing. Negative for shortness of breath.   Cardiovascular: Negative.  Negative for palpitations.       Chest tightness with coughing  Gastrointestinal: Negative.   Genitourinary: Negative.   Musculoskeletal: Positive for myalgias. Negative for neck pain.  Neurological: Positive for headaches.  All other systems reviewed and are negative.   Allergies  Codeine  Home Medications   Prior to Admission medications   Medication Sig Start Date End Date Taking? Authorizing Provider  cefUROXime (CEFTIN) 250 MG tablet Take 1 tablet (250 mg total) by mouth 2 (two) times daily with a meal. 07/20/14   Graylon Good, PA-C  dextromethorphan (DELSYM) 30 MG/5ML liquid Take 10 mLs (60 mg total) by mouth 2 (two) times daily. Prn cough 04/07/14   Linna Hoff, MD  fluconazole (DIFLUCAN) 150 MG tablet Take 1 tablet now and the second tablet after finishing your antibiotics 07/20/14   Graylon Good, PA-C  minocycline (MINOCIN,DYNACIN) 100 MG capsule Take 1 capsule (100 mg total) by mouth 2 (two) times daily. 04/07/14   Linna Hoff, MD  neomycin-polymyxin-hydrocortisone (CORTISPORIN) 3.5-10000-1 otic suspension Place 4 drops into the right ear 3 (three) times daily. X 7 days 09/03/14   Ria Clock, PA  Meds Ordered and Administered this Visit  Medications - No data to display  Filed Vitals:   01/25/15 1316 01/25/15 1328  BP: 188/89 129/80  Pulse: 90   Temp: 98.5 F (36.9 C)   TempSrc: Oral   Resp: 18   SpO2: 97%      Physical Exam  Constitutional: She is oriented to person, place, and time. She appears well-developed. No distress.  Cardiovascular: Normal rate, regular rhythm, normal heart sounds and intact distal pulses.   No  murmur heard. Pulmonary/Chest: Effort normal. No accessory muscle usage. She has no decreased breath sounds. She has rhonchi.    Abdominal: Soft. Bowel sounds are normal. There is no tenderness.  Neurological: She is alert and oriented to person, place, and time.  Nursing note and vitals reviewed.   ED Course  Procedures (including critical care time)  Labs Review Labs Reviewed - No data to display  Imaging Review No results found.   Visual Acuity Review  Right Eye Distance:   Left Eye Distance:   Bilateral Distance:    Right Eye Near:   Left Eye Near:    Bilateral Near:      Dg Chest 2 View  01/25/2015   CLINICAL DATA:  Cough for 1 week  EXAM: CHEST  2 VIEW  COMPARISON:  04/07/2014  FINDINGS: Midline trachea. Normal heart size and mediastinal contours. No pleural effusion or pneumothorax. Minimal scarring at the left lung base laterally, similar.  IMPRESSION: No acute cardiopulmonary disease.   Electronically Signed   By: Jeronimo Greaves M.D.   On: 01/25/2015 13:52     MDM  No diagnosis found. Cough: Bronchitis Elevated BP.  1. Likely viral infection. Chest xray done was normal. Patient prescribed Zpack to start if no improvement in 2-3 days. Continue OTC cough regiment.Return precaution discussed. 2. Rechecked BP improved. Continue monitoring.     Doreene Eland, MD 01/25/15 [phone]

## 2015-01-25 NOTE — ED Notes (Signed)
C/o cold sx onset 1 week Sx include chest tightness, dry cough, HA, chills, congestion Taking OTC cold meds w/no relief Alert... No signs of acute distress.

## 2015-09-12 ENCOUNTER — Ambulatory Visit (HOSPITAL_COMMUNITY): Payer: 59

## 2015-09-12 ENCOUNTER — Encounter (HOSPITAL_COMMUNITY): Payer: Self-pay

## 2015-09-12 ENCOUNTER — Ambulatory Visit (INDEPENDENT_AMBULATORY_CARE_PROVIDER_SITE_OTHER): Payer: 59

## 2015-09-12 ENCOUNTER — Ambulatory Visit (HOSPITAL_COMMUNITY)
Admission: EM | Admit: 2015-09-12 | Discharge: 2015-09-12 | Disposition: A | Discharge status: Home / Self Care | Payer: 59 | Attending: Emergency Medicine | Admitting: Emergency Medicine

## 2015-09-12 DIAGNOSIS — R0789 Other chest pain: Secondary | ICD-10-CM

## 2015-09-12 MED ORDER — PREDNISONE 50 MG PO TABS: ORAL_TABLET | ORAL | Status: AC

## 2015-09-12 NOTE — ED Notes (Signed)
Patient states she has been having chest pain since this morning, patient states she thinks it may be gas but wants to be checked to be sure. No acute distress

## 2015-09-12 NOTE — Discharge Instructions (Signed)
Your EKG and chest x-ray are normal today. The pain you are having is likely coming from inflammation and irritation of the cartilage in your chest wall. Take prednisone daily for 5 days. Take ibuprofen 600 mg every 6 hours as needed for pain. This should gradually improve over the next several days.  If te pain changes, becomes constant, causes you to be dizzy or short of breath, go to the emergency room.

## 2015-09-12 NOTE — ED Provider Notes (Signed)
CSN: [id number]     Arrival date & time 09/12/15  1259 History   First MD Initiated Contact with Patient 09/12/15 1317     Chief Complaint  Patient presents with  . Chest Pain   (Consider location/radiation/quality/duration/timing/severity/associated sxs/prior Treatment) HPI  She is a 50 year old woman here for evaluation of chest pain. She states this started when she woke up this morning. The pain may have woken her from sleep. She describes intermittent sharp pains to the left anterior chest. They occur every 20-30 minutes and last for a few seconds. No associated nausea, vomiting, diaphoresis, dizziness, or shortness of breath. When the pain is occurring, taking a deep breath makes it worse. Taking a deep breath does not trigger the pain. Over the course of the day the pain has been getting more intense. It also radiates through to her left axillary area. Other than low blood sugars, she has no other medical history. She is not currently taking any medications.  She is not on any hormone supplements her birth control. No recent surgery. No history of cancer. No recent car trips or plane ride. He denies any leg swelling. No personal or family history of blood clots. She is a nonsmoker. She does use marijuana recreationally.  She reports a mild cold several weeks ago, but denies any lingering cough.  Past Medical History  Diagnosis Date  . Hypoglycemia    Past Surgical History  Procedure Laterality Date  . Appendectomy    . Rotator cuff repair    . Abdominal hysterectomy     Family History  Problem Relation Age of Onset  . Hypertension Mother   . Hypertension Father    Social History  Substance Use Topics  . Smoking status: Never Smoker   . Smokeless tobacco: Never Used  . Alcohol Use: Yes     Comment: occasional   OB History    No data available     Review of Systems As in history of present illness Allergies  Codeine  Home Medications   Prior to Admission medications    Medication Sig Start Date End Date Taking? Authorizing Provider  predniSONE (DELTASONE) 50 MG tablet Take 1 pill daily for 5 days. 09/12/15   Charm RingsErin J Imya Mance, MD   Meds Ordered and Administered this Visit  Medications - No data to display  BP 133/82 mmHg  Pulse 75  Temp(Src) 98 F (36.7 C) (Oral)  SpO2 100% No data found.   Physical Exam  Constitutional: She is oriented to person, place, and time. She appears well-developed and well-nourished. No distress.  Neck: Neck supple.  Cardiovascular: Normal rate, regular rhythm and normal heart sounds.   No murmur heard. Pulses:      Dorsalis pedis pulses are 2+ on the right side, and 2+ on the left side.  Pulmonary/Chest: Effort normal and breath sounds normal. No respiratory distress. She has no wheezes. She has no rales. She exhibits tenderness (she is tender at the left sternal border. This is where her pain occurs, but palpation causes a different pain.).  Musculoskeletal: She exhibits no edema or tenderness.  Neurological: She is alert and oriented to person, place, and time.    ED Course  Procedures (including critical care time) ED ECG REPORT   Date: 09/12/2015  Rate: 68  Rhythm: normal sinus rhythm  QRS Axis: normal  Intervals: normal  ST/T Wave abnormalities: normal  Conduction Disutrbances:none  Narrative Interpretation: NSR, normal ekg  Old EKG Reviewed: none available  I have personally  reviewed the EKG tracing and agree with the computerized printout as noted.  Labs Review Labs Reviewed - No data to display  Imaging Review Dg Chest 2 View  09/12/2015  CLINICAL DATA:  Chest pain since the morning at 0600 hours, awoke with pain, LEFT upper quadrant pain radiating to back, child asthma, marijuana user EXAM: CHEST  2 VIEW COMPARISON:  01/25/2015 FINDINGS: Normal heart size, mediastinal contours, and pulmonary vascularity. Mild peribronchial thickening. Question RIGHT nipple shadow, unable to confirm on lateral view due  to exclusion of the anterior breasts. No pulmonary infiltrate, pleural effusion, or pneumothorax. Bones unremarkable. IMPRESSION: Minimal bronchitic changes without infiltrate. Question RIGHT nipple shadow; repeat PA chest radiograph with nipple markers recommended to exclude pulmonary nodule. Electronically Signed   By: Ulyses Southward M.D.   On: 09/12/2015 14:16      MDM   1. Chest wall pain    EKG is normal. Chest x-ray with minimal bronchitic changes. Repeat PA film with nipple markers confirms nipple shadow. Likely chest wall pain. We'll treat with prednisone and ibuprofen. Return precautions reviewed.    Charm Rings, MD 09/12/15 [phone]

## 2016-06-21 ENCOUNTER — Emergency Department (HOSPITAL_COMMUNITY)
Admission: EM | Admit: 2016-06-21 | Discharge: 2016-06-21 | Disposition: A | Discharge status: Home / Self Care | Payer: 59 | Attending: Emergency Medicine | Admitting: Emergency Medicine

## 2016-06-21 ENCOUNTER — Encounter (HOSPITAL_COMMUNITY): Payer: Self-pay | Admitting: *Deleted

## 2016-06-21 DIAGNOSIS — J329 Chronic sinusitis, unspecified: Secondary | ICD-10-CM | POA: Diagnosis not present

## 2016-06-21 DIAGNOSIS — R51 Headache: Secondary | ICD-10-CM | POA: Diagnosis present

## 2016-06-21 DIAGNOSIS — F17200 Nicotine dependence, unspecified, uncomplicated: Secondary | ICD-10-CM | POA: Diagnosis not present

## 2016-06-21 DIAGNOSIS — Z79899 Other long term (current) drug therapy: Secondary | ICD-10-CM | POA: Diagnosis not present

## 2016-06-21 MED ORDER — IBUPROFEN 800 MG PO TABS: 800.0000 mg | ORAL_TABLET | Freq: Three times a day (TID) | ORAL | 0 refills | Status: AC

## 2016-06-21 MED ORDER — IBUPROFEN 800 MG PO TABS
800.0000 mg | ORAL_TABLET | Freq: Once | ORAL | Status: AC
  Administered 2016-06-21: 800 mg via ORAL
  Filled 2016-06-21: qty 1

## 2016-06-21 MED ORDER — FLUTICASONE PROPIONATE 50 MCG/ACT NA SUSP: 2.0000 | Freq: Every day | NASAL | 0 refills | Status: AC

## 2016-06-21 MED ORDER — CETIRIZINE HCL 10 MG PO TABS: 10.0000 mg | ORAL_TABLET | Freq: Every day | ORAL | 0 refills | Status: AC

## 2016-06-21 NOTE — Discharge Instructions (Signed)
Take 800 mg ibuprofen for pain/headache. Take zyrtec and use flonase daily.  Follow up with your primary care physician in the next couple of days.  Return to the ED for any new or concerning symptoms.

## 2016-06-21 NOTE — ED Provider Notes (Signed)
[facility] Provider Note   CSN: [id number] Arrival date & time: 06/21/16  7829     History   Chief Complaint Chief Complaint  Patient presents with  . Headache  . Nasal Congestion    HPI Patricia Pierce is a 51 y.o. female.  HPI Patient presents for evaluation of one day history of nasal congestion with associated dry cough and frontal headache. She has taken Mucinex for her symptoms. No visual disturbances, numbness, weakness, sore throat, fever, chills. Symptom onset sudden, mild, unchanging.  Past Medical History:  Diagnosis Date  . Hypoglycemia     There are no active problems to display for this patient.   Past Surgical History:  Procedure Laterality Date  . ABDOMINAL HYSTERECTOMY    . APPENDECTOMY    . ROTATOR CUFF REPAIR      OB History    No data available       Home Medications    Prior to Admission medications   Medication Sig Start Date End Date Taking? Authorizing Provider  cetirizine (ZYRTEC ALLERGY) 10 MG tablet Take 1 tablet (10 mg total) by mouth daily. 06/21/16   Arnold Kester, PA-C  fluticasone (FLONASE) 50 MCG/ACT nasal spray Place 2 sprays into both nostrils daily. 06/21/16   Cheri Fowler, PA-C  ibuprofen (ADVIL,MOTRIN) 800 MG tablet Take 1 tablet (800 mg total) by mouth 3 (three) times daily. 06/21/16   Cheri Fowler, PA-C  predniSONE (DELTASONE) 50 MG tablet Take 1 pill daily for 5 days. 09/12/15   Charm Rings, MD    Family History Family History  Problem Relation Age of Onset  . Hypertension Mother   . Hypertension Father     Social History Social History  Substance Use Topics  . Smoking status: Light Tobacco Smoker  . Smokeless tobacco: Never Used  . Alcohol use Yes     Comment: occasional     Allergies   Codeine   Review of Systems Review of Systems All other systems negative unless otherwise stated in HPI   Physical Exam Updated Vital Signs BP 134/83 (BP Location: Left Arm)   Pulse 65   Temp 97.8 F (36.6 C)  (Oral)   Resp 16   Ht 5\' 3"  (1.6 m)   Wt 68.5 kg   SpO2 99%   BMI 26.75 kg/m   Physical Exam  Constitutional: She is oriented to person, place, and time. She appears well-developed and well-nourished. She is active.  Non-toxic appearance. She does not have a sickly appearance. She does not appear ill.  HENT:  Head: Normocephalic and atraumatic.  Right Ear: Tympanic membrane and external ear normal. Tympanic membrane is not erythematous and not bulging.  Left Ear: Tympanic membrane and external ear normal. Tympanic membrane is not erythematous and not bulging.  Nose: Mucosal edema present. No rhinorrhea. Right sinus exhibits no maxillary sinus tenderness and no frontal sinus tenderness. Left sinus exhibits no maxillary sinus tenderness and no frontal sinus tenderness.  Mouth/Throat: Uvula is midline, oropharynx is clear and moist and mucous membranes are normal. No trismus in the jaw. No uvula swelling. No oropharyngeal exudate, posterior oropharyngeal edema, posterior oropharyngeal erythema or tonsillar abscesses.  Eyes: Pupils are equal, round, and reactive to light.  Neck: Normal range of motion. Neck supple.  No nuchal rigidity.   Cardiovascular: Normal rate and regular rhythm.   Pulmonary/Chest: Effort normal and breath sounds normal. No respiratory distress. She has no wheezes. She has no rales.  Abdominal: Soft. Bowel sounds are normal. She exhibits  no distension. There is no tenderness.  Musculoskeletal: Normal range of motion.  Lymphadenopathy:    She has no cervical adenopathy.  Neurological: She is alert and oriented to person, place, and time.  Normal strength and sensation throughout upper and lower extremities.   Skin: Skin is warm and dry.  Psychiatric: She has a normal mood and affect. Her behavior is normal.     ED Treatments / Results  Labs (all labs ordered are listed, but only abnormal results are displayed) Labs Reviewed - No data to display  EKG  EKG  Interpretation None       Radiology No results found.  Procedures Procedures (including critical care time)  Medications Ordered in ED Medications  ibuprofen (ADVIL,MOTRIN) tablet 800 mg (not administered)     Initial Impression / Assessment and Plan / ED Course  I have reviewed the triage vital signs and the nursing notes.  Pertinent labs & imaging results that were available during my care of the patient were reviewed by me and considered in my medical decision making (see chart for details).     Patient complaining of symptoms of sinusitis.     Mild to moderate symptoms of clear/yellow nasal discharge/congestion and scratchy throat with cough for less than 10 days.  Patient is afebrile.  No concern for acute bacterial rhinosinusitis; likely viral in nature.  Patient discharged with symptomatic treatment.  Patient instructions given for warm saline nasal washes.  Recommendations for follow-up with primary care physician.     Final Clinical Impressions(s) / ED Diagnoses   Final diagnoses:  Sinusitis, unspecified chronicity, unspecified location    New Prescriptions New Prescriptions   CETIRIZINE (ZYRTEC ALLERGY) 10 MG TABLET    Take 1 tablet (10 mg total) by mouth daily.   FLUTICASONE (FLONASE) 50 MCG/ACT NASAL SPRAY    Place 2 sprays into both nostrils daily.   IBUPROFEN (ADVIL,MOTRIN) 800 MG TABLET    Take 1 tablet (800 mg total) by mouth 3 (three) times daily.     Cheri FowlerKayla Arrielle Mcginn, PA-C 06/21/16 1224    Lyndal Pulleyaniel Knott, MD 06/21/16 [phone]

## 2016-06-21 NOTE — ED Triage Notes (Signed)
States she woke up yest with nasal congestion worse today

## 2016-07-20 ENCOUNTER — Encounter (HOSPITAL_COMMUNITY): Payer: Self-pay

## 2016-07-20 ENCOUNTER — Emergency Department (HOSPITAL_COMMUNITY): Payer: 59

## 2016-07-20 DIAGNOSIS — R05 Cough: Secondary | ICD-10-CM | POA: Diagnosis not present

## 2016-07-20 DIAGNOSIS — F17200 Nicotine dependence, unspecified, uncomplicated: Secondary | ICD-10-CM | POA: Insufficient documentation

## 2016-07-20 LAB — BASIC METABOLIC PANEL
ANION GAP: 10 (ref 5–15)
BUN: 11 mg/dL (ref 6–20)
CHLORIDE: 107 mmol/L (ref 101–111)
CO2: 25 mmol/L (ref 22–32)
Calcium: 10.1 mg/dL (ref 8.9–10.3)
Creatinine, Ser: 0.89 mg/dL (ref 0.44–1.00)
GFR calc non Af Amer: >60 mL/min (ref >60)
Glucose, Bld: 94 mg/dL (ref 65–99)
POTASSIUM: 3.6 mmol/L (ref 3.5–5.1)
SODIUM: 142 mmol/L (ref 135–145)

## 2016-07-20 LAB — CBC
HEMATOCRIT: 41.4 % (ref 36.0–46.0)
HEMOGLOBIN: 13.4 g/dL (ref 12.0–15.0)
MCH: 27.4 pg (ref 26.0–34.0)
MCHC: 32.4 g/dL (ref 30.0–36.0)
MCV: 84.7 fL (ref 78.0–100.0)
Platelets: 259 10*3/uL (ref 150–400)
RBC: 4.89 MIL/uL (ref 3.87–5.11)
RDW: 13.2 % (ref 11.5–15.5)
WBC: 7 10*3/uL (ref 4.0–10.5)

## 2016-07-20 LAB — I-STAT TROPONIN, ED: Troponin i, poc: 0 ng/mL (ref 0.00–0.08)

## 2016-07-20 NOTE — ED Triage Notes (Signed)
Onset yesterday cough.  Pt taking OTC meds with no improvement.  Chest has been tight all day but 1 hour PTA pt reports "feels like something sitting on chest and sharp pains in upper back".  Talking in complete sentences.  No respiratory difficulties.

## 2016-07-21 ENCOUNTER — Emergency Department (HOSPITAL_COMMUNITY)
Admission: EM | Admit: 2016-07-21 | Discharge: 2016-07-21 | Disposition: A | Discharge status: Home / Self Care | Payer: 59 | Attending: Emergency Medicine | Admitting: Emergency Medicine

## 2016-07-21 DIAGNOSIS — R05 Cough: Secondary | ICD-10-CM

## 2016-07-21 DIAGNOSIS — R059 Cough, unspecified: Secondary | ICD-10-CM

## 2016-07-21 MED ORDER — BENZONATATE 100 MG PO CAPS: 100.0000 mg | ORAL_CAPSULE | Freq: Three times a day (TID) | ORAL | 0 refills | Status: AC

## 2016-07-21 MED ORDER — ALBUTEROL SULFATE (2.5 MG/3ML) 0.083% IN NEBU
5.0000 mg | INHALATION_SOLUTION | Freq: Once | RESPIRATORY_TRACT | Status: AC
  Administered 2016-07-21: 5 mg via RESPIRATORY_TRACT

## 2016-07-21 MED ORDER — IPRATROPIUM BROMIDE 0.02 % IN SOLN
0.5000 mg | Freq: Once | RESPIRATORY_TRACT | Status: AC
  Administered 2016-07-21: 0.5 mg via RESPIRATORY_TRACT
  Filled 2016-07-21: qty 2.5

## 2016-07-21 NOTE — ED Provider Notes (Signed)
[facility] Provider Note   CSN: [id number] Arrival date & time: 07/20/16  2118     History   Chief Complaint Chief Complaint  Patient presents with  . Chest Pain  . Cough    HPI Patricia Pierce is a 51 y.o. female.  Patient presents with complaint of cough since yesterday. She reports cough is recurrent from 2-3 weeks ago when she was treated with abx for a sinus infection. No recurrent sinus pressure or significant congestion now. No nausea or vomiting. No fever or body aches.   The history is provided by the patient. No language interpreter was used.  Chest Pain   Associated symptoms include cough. Pertinent negatives include no fever.  Cough  Associated symptoms include chest pain. Pertinent negatives include no chills and no myalgias.    Past Medical History:  Diagnosis Date  . Hypoglycemia     There are no active problems to display for this patient.   Past Surgical History:  Procedure Laterality Date  . ABDOMINAL HYSTERECTOMY    . APPENDECTOMY    . ROTATOR CUFF REPAIR      OB History    No data available       Home Medications    Prior to Admission medications   Medication Sig Start Date End Date Taking? Authorizing Provider  cetirizine (ZYRTEC ALLERGY) 10 MG tablet Take 1 tablet (10 mg total) by mouth daily. 06/21/16   Kayla Rose, PA-C  fluticasone (FLONASE) 50 MCG/ACT nasal spray Place 2 sprays into both nostrils daily. 06/21/16   Cheri Fowler, PA-C  ibuprofen (ADVIL,MOTRIN) 800 MG tablet Take 1 tablet (800 mg total) by mouth 3 (three) times daily. 06/21/16   Cheri Fowler, PA-C  predniSONE (DELTASONE) 50 MG tablet Take 1 pill daily for 5 days. 09/12/15   Charm Rings, MD    Family History Family History  Problem Relation Age of Onset  . Hypertension Mother   . Hypertension Father     Social History Social History  Substance Use Topics  . Smoking status: Light Tobacco Smoker  . Smokeless tobacco: Never Used  . Alcohol use Yes   Comment: occasional     Allergies   Codeine   Review of Systems Review of Systems  Constitutional: Negative for chills and fever.  HENT: Negative.   Respiratory: Positive for cough.   Cardiovascular: Positive for chest pain.  Gastrointestinal: Negative.   Musculoskeletal: Negative.  Negative for myalgias.  Skin: Negative.   Neurological: Negative.      Physical Exam Updated Vital Signs BP 123/82   Pulse 69   Temp 97.9 F (36.6 C) (Oral)   Resp 24   SpO2 97%   Physical Exam  Constitutional: She is oriented to person, place, and time. She appears well-developed and well-nourished.  HENT:  Head: Normocephalic.  Neck: Normal range of motion. Neck supple.  Cardiovascular: Normal rate and regular rhythm.   No murmur heard. Pulmonary/Chest: Effort normal and breath sounds normal. She has no wheezes. She has no rales.  Patient actively coughing on exam.   Abdominal: Soft. Bowel sounds are normal. There is no tenderness. There is no rebound and no guarding.  Musculoskeletal: Normal range of motion.  Neurological: She is alert and oriented to person, place, and time.  Skin: Skin is warm and dry. No rash noted.  Psychiatric: She has a normal mood and affect.     ED Treatments / Results  Labs (all labs ordered are listed, but only abnormal results are displayed)  Labs Reviewed  BASIC METABOLIC PANEL  CBC  I-STAT TROPOININ, ED    EKG  EKG Interpretation  Date/Time:  Tuesday July 20 2016 21:21:52 EST Ventricular Rate:  71 PR Interval:  152 QRS Duration: 92 QT Interval:  396 QTC Calculation: 430 R Axis:   -30 Text Interpretation:  Normal sinus rhythm Incomplete right bundle branch block Abnormal ECG No significant change since last tracing Confirmed by Erroll Lunani, Adeleke Ayokunle [phone]([id number]) on 07/21/2016 2:01:37 AM       Radiology Dg Chest 2 View  Result Date: 07/20/2016 CLINICAL DATA:  Acute onset of generalized chest tightness and cough. Headache. Initial  encounter. EXAM: CHEST  2 VIEW COMPARISON:  Chest radiograph performed 09/12/2015 FINDINGS: The lungs are well-aerated and clear. There is no evidence of focal opacification, pleural effusion or pneumothorax. The heart is normal in size; the mediastinal contour is within normal limits. No acute osseous abnormalities are seen. IMPRESSION: No acute cardiopulmonary process seen. Electronically Signed   By: Roanna RaiderJeffery  Chang M.D.   On: 07/20/2016 21:58    Procedures Procedures (including critical care time)  Medications Ordered in ED Medications  albuterol (PROVENTIL) (2.5 MG/3ML) 0.083% nebulizer solution 5 mg (5 mg Nebulization Given 07/21/16 0404)  ipratropium (ATROVENT) nebulizer solution 0.5 mg (0.5 mg Nebulization Given 07/21/16 0404)     Initial Impression / Assessment and Plan / ED Course  I have reviewed the triage vital signs and the nursing notes.  Pertinent labs & imaging results that were available during my care of the patient were reviewed by me and considered in my medical decision making (see chart for details).     Patient cough that is persistent since yesterday. She was concerned about recurrent infection given recnet sinus infection that improved/resolved with abx.   CXR clear, labs reassuring. Breathing treatment provided in attempt to control cough. She is stable and can be discharged home. No evidence bacterial illness. She will need supportive.   Final Clinical Impressions(s) / ED Diagnoses   Final diagnoses:  None   1. Cough  New Prescriptions New Prescriptions   No medications on file     Elpidio AnisShari Idali Lafever, PA-C 07/21/16 0505    Tomasita CrumbleAdeleke Oni, MD 07/21/16 [phone]

## 2016-07-21 NOTE — Discharge Instructions (Signed)
Follow up with your doctor if symptoms persist.  °

## 2018-08-27 IMAGING — DX DG CHEST 2V
2 series · 2 of 2 positions shown · non-contrast
Comparison: Chest radiograph performed 09/12/2015

CLINICAL DATA: Acute onset of generalized chest tightness and
cough. Headache. Initial encounter.

EXAM:
CHEST  2 VIEW

[w chest pa]
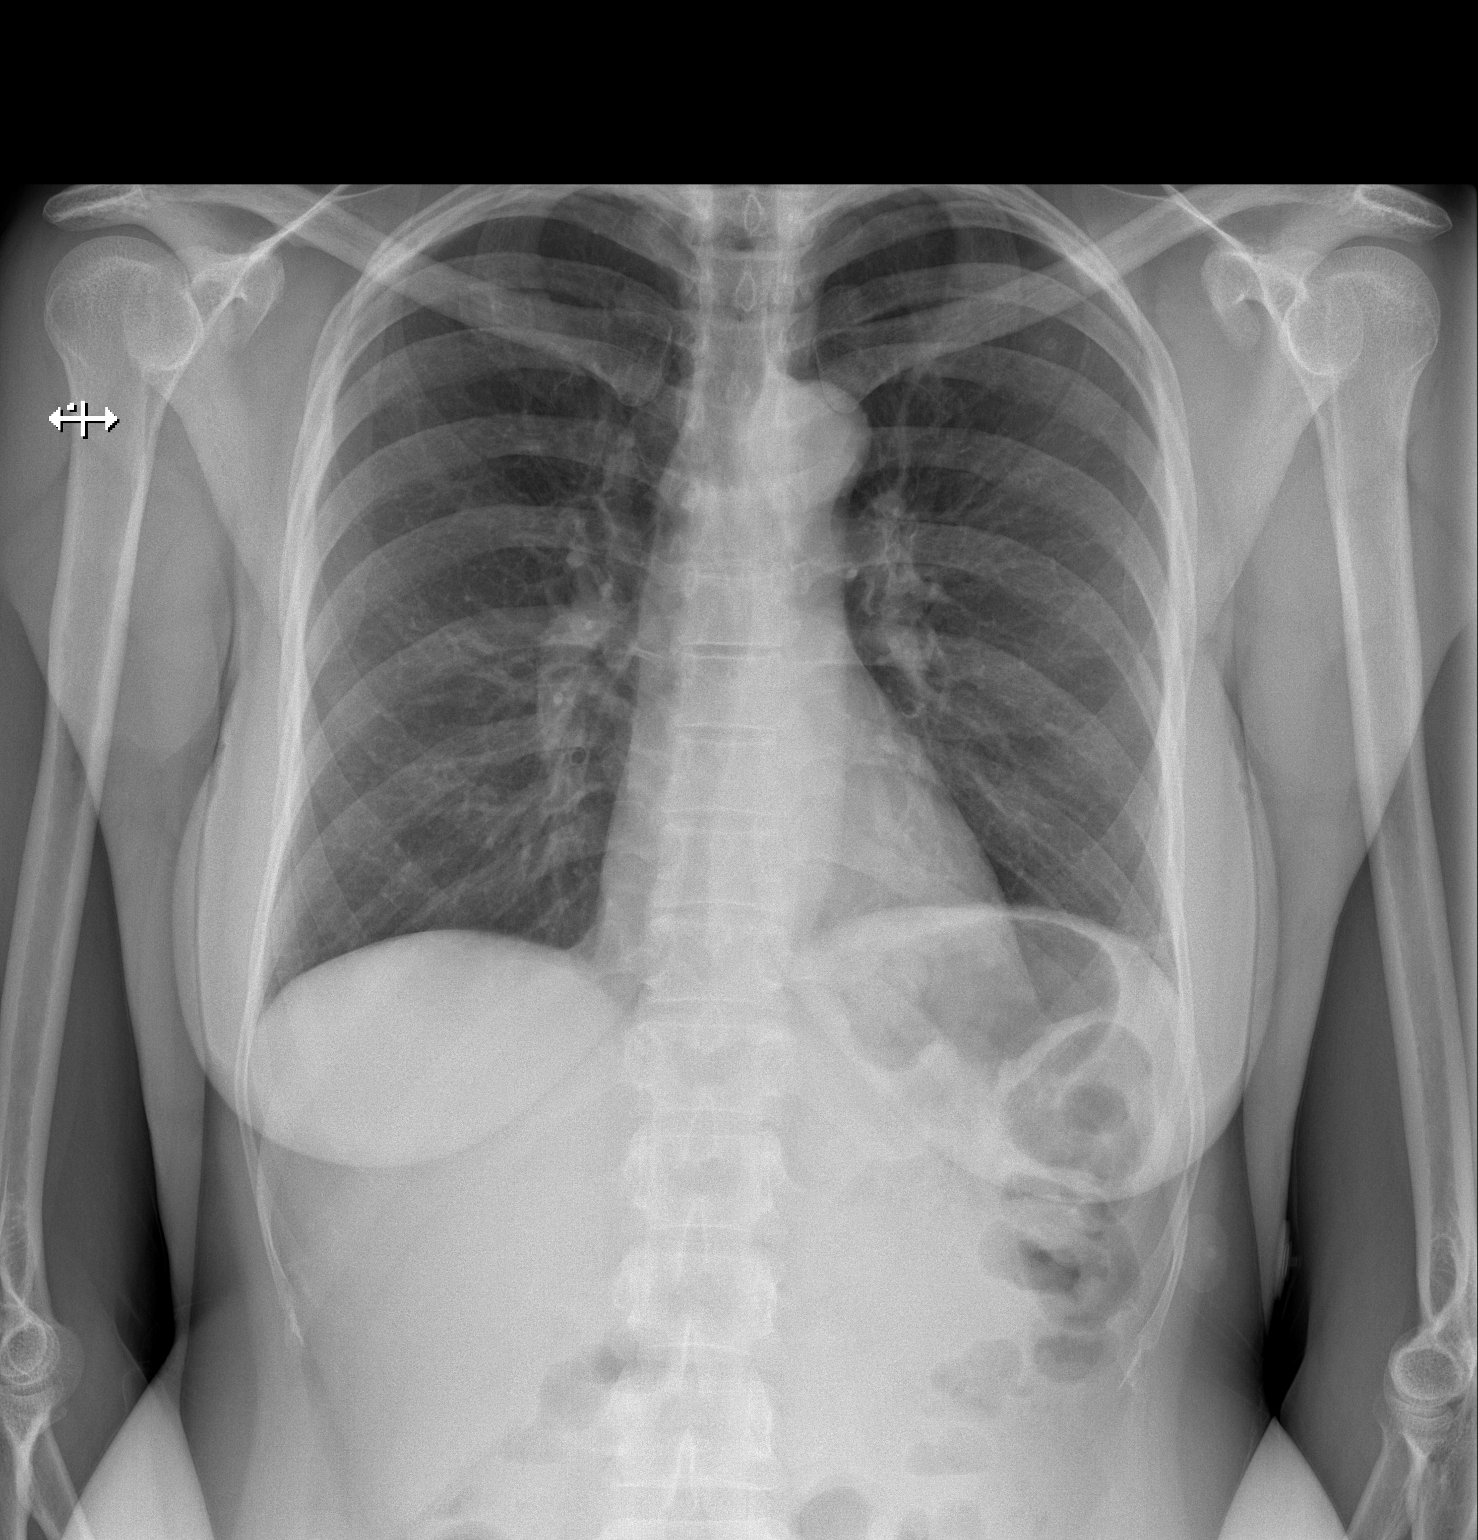

[w chest lat]
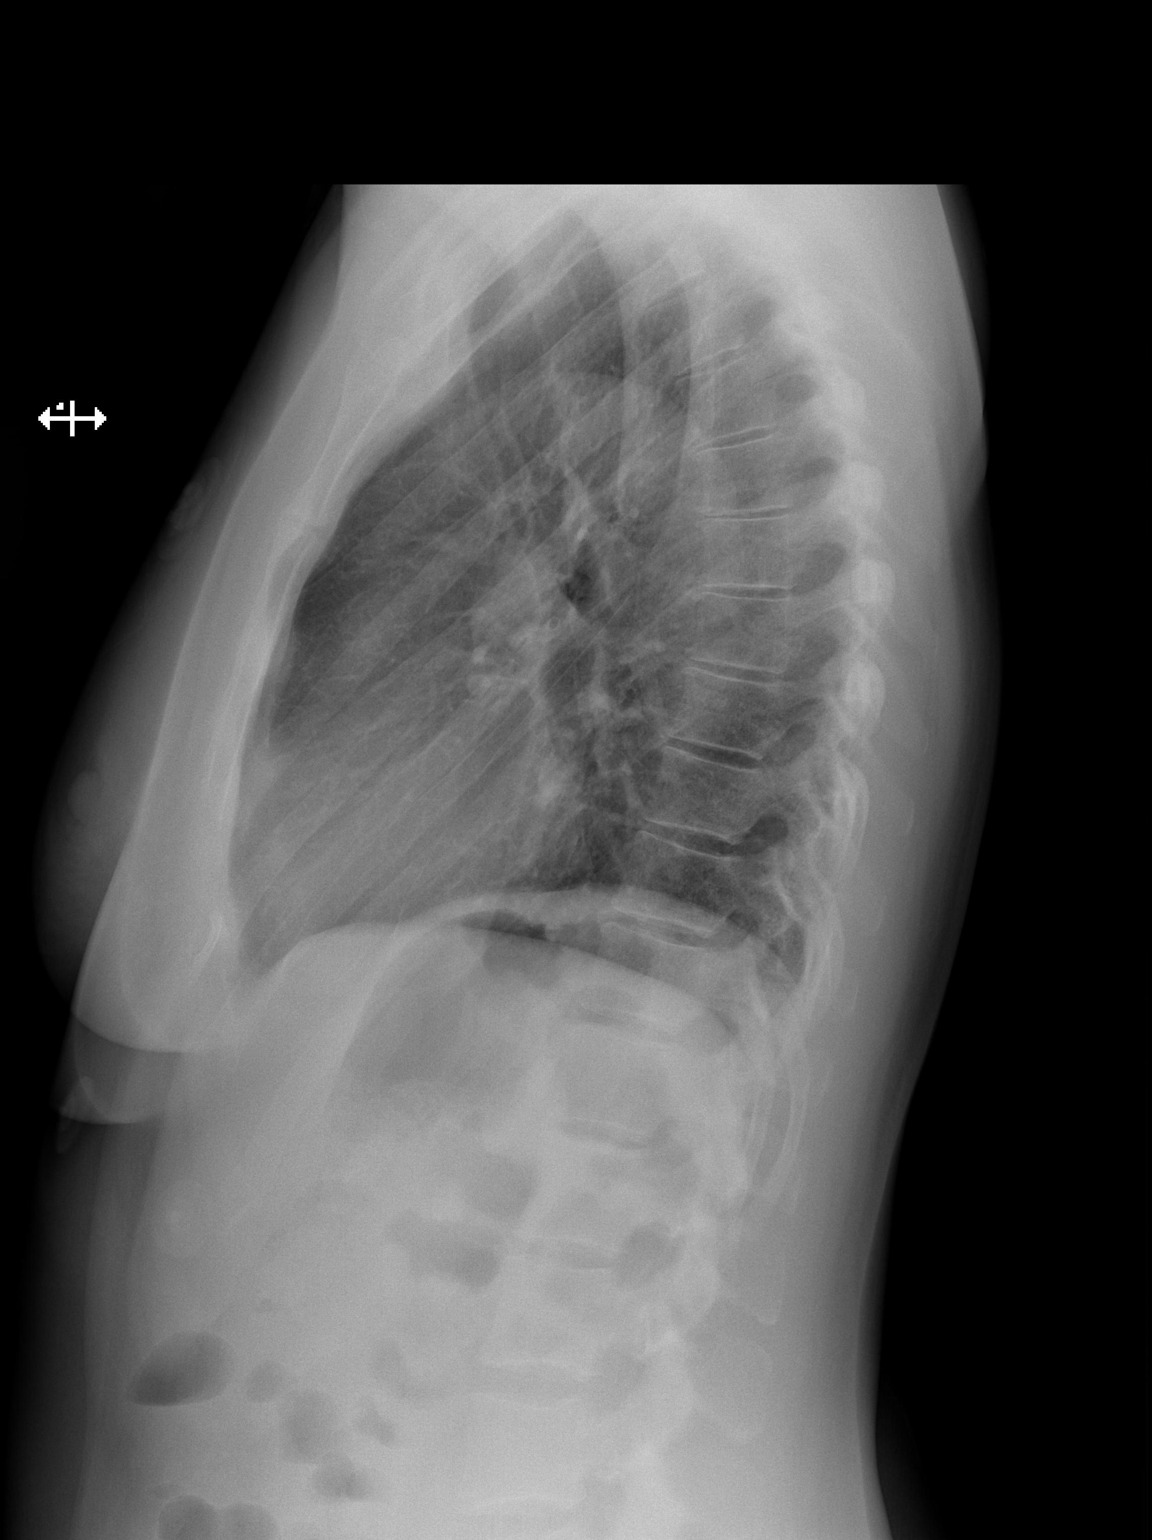

[2 of 2 positions shown; findings below may reference images not displayed]

FINDINGS: The lungs are well-aerated and clear. There is no evidence of focal
opacification, pleural effusion or pneumothorax.

The heart is normal in size; the mediastinal contour is within
normal limits. No acute osseous abnormalities are seen.
IMPRESSION: No acute cardiopulmonary process seen.

## 2020-08-30 DIAGNOSIS — G5621 Lesion of ulnar nerve, right upper limb: Secondary | ICD-10-CM | POA: Insufficient documentation

## 2020-08-30 DIAGNOSIS — R319 Hematuria, unspecified: Secondary | ICD-10-CM | POA: Insufficient documentation

## 2023-12-22 ENCOUNTER — Emergency Department (HOSPITAL_COMMUNITY)

## 2023-12-22 ENCOUNTER — Encounter (HOSPITAL_COMMUNITY): Payer: Self-pay

## 2023-12-22 ENCOUNTER — Ambulatory Visit: Admission: EM | Admit: 2023-12-22 | Discharge: 2023-12-22 | Disposition: A | Discharge status: Home / Self Care

## 2023-12-22 ENCOUNTER — Encounter: Payer: Self-pay | Admitting: Emergency Medicine

## 2023-12-22 ENCOUNTER — Emergency Department (HOSPITAL_COMMUNITY)
Admission: EM | Admit: 2023-12-22 | Discharge: 2023-12-22 | Disposition: A | Discharge status: Home / Self Care | Source: Ambulatory Visit | Attending: Emergency Medicine | Admitting: Emergency Medicine

## 2023-12-22 ENCOUNTER — Other Ambulatory Visit: Payer: Self-pay

## 2023-12-22 DIAGNOSIS — M79604 Pain in right leg: Secondary | ICD-10-CM | POA: Diagnosis not present

## 2023-12-22 DIAGNOSIS — U071 COVID-19: Secondary | ICD-10-CM | POA: Insufficient documentation

## 2023-12-22 DIAGNOSIS — J069 Acute upper respiratory infection, unspecified: Secondary | ICD-10-CM

## 2023-12-22 DIAGNOSIS — E876 Hypokalemia: Secondary | ICD-10-CM | POA: Diagnosis not present

## 2023-12-22 DIAGNOSIS — L918 Other hypertrophic disorders of the skin: Secondary | ICD-10-CM | POA: Insufficient documentation

## 2023-12-22 DIAGNOSIS — R509 Fever, unspecified: Secondary | ICD-10-CM | POA: Diagnosis present

## 2023-12-22 DIAGNOSIS — F17200 Nicotine dependence, unspecified, uncomplicated: Secondary | ICD-10-CM | POA: Insufficient documentation

## 2023-12-22 DIAGNOSIS — M79605 Pain in left leg: Secondary | ICD-10-CM

## 2023-12-22 LAB — CBC WITH DIFFERENTIAL/PLATELET
Abs Immature Granulocytes: 0.02 K/uL (ref 0.00–0.07)
Basophils Absolute: 0 K/uL (ref 0.0–0.1)
Basophils Relative: 0 %
Eosinophils Absolute: 0 K/uL (ref 0.0–0.5)
Eosinophils Relative: 1 %
HCT: 42.2 % (ref 36.0–46.0)
Hemoglobin: 13.2 g/dL (ref 12.0–15.0)
Immature Granulocytes: 0 %
Lymphocytes Relative: 8 %
Lymphs Abs: 0.5 K/uL — ABNORMAL LOW (ref 0.7–4.0)
MCH: 26.9 pg (ref 26.0–34.0)
MCHC: 31.3 g/dL (ref 30.0–36.0)
MCV: 86.1 fL (ref 80.0–100.0)
Monocytes Absolute: 0.5 K/uL (ref 0.1–1.0)
Monocytes Relative: 9 %
Neutro Abs: 5 K/uL (ref 1.7–7.7)
Neutrophils Relative %: 82 %
Platelets: 307 K/uL (ref 150–400)
RBC: 4.9 MIL/uL (ref 3.87–5.11)
RDW: 13.1 % (ref 11.5–15.5)
WBC: 6.1 K/uL (ref 4.0–10.5)
nRBC: 0 % (ref 0.0–0.2)

## 2023-12-22 LAB — COMPREHENSIVE METABOLIC PANEL WITH GFR
ALT: 19 U/L (ref 0–44)
AST: 19 U/L (ref 15–41)
Albumin: 4.5 g/dL (ref 3.5–5.0)
Alkaline Phosphatase: 73 U/L (ref 38–126)
Anion gap: 8 (ref 5–15)
BUN: 9 mg/dL (ref 6–20)
CO2: 23 mmol/L (ref 22–32)
Calcium: 10 mg/dL (ref 8.9–10.3)
Chloride: 106 mmol/L (ref 98–111)
Creatinine, Ser: 0.86 mg/dL (ref 0.44–1.00)
GFR, Estimated: >60 mL/min (ref >60)
Glucose, Bld: 99 mg/dL (ref 70–99)
Potassium: 3.4 mmol/L — ABNORMAL LOW (ref 3.5–5.1)
Sodium: 137 mmol/L (ref 135–145)
Total Bilirubin: 0.4 mg/dL (ref 0.0–1.2)
Total Protein: 8.2 g/dL — ABNORMAL HIGH (ref 6.5–8.1)

## 2023-12-22 LAB — URINALYSIS, W/ REFLEX TO CULTURE (INFECTION SUSPECTED)
Bilirubin Urine: NEGATIVE
Glucose, UA: NEGATIVE mg/dL
Ketones, ur: NEGATIVE mg/dL
Leukocytes,Ua: NEGATIVE
Nitrite: NEGATIVE
Protein, ur: NEGATIVE mg/dL
Specific Gravity, Urine: 1.015 (ref 1.005–1.030)
pH: 7 (ref 5.0–8.0)

## 2023-12-22 LAB — RESP PANEL BY RT-PCR (RSV, FLU A&B, COVID)  RVPGX2
Influenza A by PCR: NEGATIVE
Influenza B by PCR: NEGATIVE
Resp Syncytial Virus by PCR: NEGATIVE
SARS Coronavirus 2 by RT PCR: POSITIVE — AB

## 2023-12-22 LAB — I-STAT CG4 LACTIC ACID, ED: Lactic Acid, Venous: 0.8 mmol/L (ref 0.5–1.9)

## 2023-12-22 LAB — POC SARS CORONAVIRUS 2 AG -  ED: SARS Coronavirus 2 Ag: NEGATIVE

## 2023-12-22 MED ORDER — ACETAMINOPHEN 325 MG PO TABS
650.0000 mg | ORAL_TABLET | Freq: Once | ORAL | Status: AC | PRN
  Administered 2023-12-22: 650 mg via ORAL
  Filled 2023-12-22: qty 2

## 2023-12-22 MED ORDER — SODIUM CHLORIDE 0.9 % IV BOLUS
1000.0000 mL | Freq: Once | INTRAVENOUS | Status: AC
  Administered 2023-12-22: 1000 mL via INTRAVENOUS

## 2023-12-22 MED ORDER — KETOROLAC TROMETHAMINE 30 MG/ML IJ SOLN
30.0000 mg | Freq: Once | INTRAMUSCULAR | Status: AC
  Administered 2023-12-22: 30 mg via INTRAVENOUS
  Filled 2023-12-22: qty 1

## 2023-12-22 MED ORDER — PAXLOVID (300/100) 20 X 150 MG & 10 X 100MG PO TBPK: 3.0000 | ORAL_TABLET | Freq: Two times a day (BID) | ORAL | 0 refills | Status: AC

## 2023-12-22 MED ORDER — ACETAMINOPHEN 325 MG PO TABS
650.0000 mg | ORAL_TABLET | Freq: Once | ORAL | Status: AC
  Administered 2023-12-22: 650 mg via ORAL
  Filled 2023-12-22: qty 2

## 2023-12-22 NOTE — ED Triage Notes (Signed)
 Patient c/o low fever, cough, and body aches, specifically back and leg pain, that started yesterday. Covid test negative at [facility], patient is tachypneic and tachycardic in triage.

## 2023-12-22 NOTE — ED Notes (Signed)
 Patient is being discharged from the Urgent Care and sent to the Emergency Department via POV with family  . Per Billy, PA-C, patient is in need of higher level of care due to need for further evaluation of severe back and bilateral leg pain. Patient is aware and verbalizes understanding of plan of care.  Vitals:   12/22/23 1654  BP: (!) 151/98  Pulse: 93  Resp: 20  Temp: 99 F (37.2 C)  SpO2: 95%

## 2023-12-22 NOTE — ED Provider Notes (Signed)
 [facility]    CSN: [id number] Arrival date & time: 12/22/23  1632      History   Chief Complaint Chief Complaint  Patient presents with   Back Pain   Cough   Headache    HPI Patricia Pierce is a 58 y.o. female.   Patient here today for evaluation of dry cough and headache that started yesterday.  She reports had low-grade fevers.  Today she reports that she developed severe back pain that has now spread to her legs.  She states that pain has become a 10 out of 10.  She has tried taking DayQuil without relief.     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      Home Medications    Prior to Admission medications   Medication Sig Start Date End Date Taking? Authorizing Provider  valACYclovir  (VALTREX ) 500 MG tablet Take 500 mg by mouth as directed. 09/29/23  Yes [provider]  acyclovir (ZOVIRAX) 400 MG tablet TAKE 1 TABLET BY MOUTH THREE TIMES DAILY FOR 5 DAYS Patient not taking: Reported on 12/22/2023    [provider]  benzonatate  (TESSALON ) 100 MG capsule Take 1 capsule (100 mg total) by mouth every 8 (eight) hours. Patient not taking: Reported on 12/22/2023 07/21/16   Odell Balls, PA-C  cetirizine  (ZYRTEC  ALLERGY) 10 MG tablet Take 1 tablet (10 mg total) by mouth daily. Patient not taking: Reported on 07/21/2016 06/21/16   Rumalda Fleeting, PA-C  COVID-19 At Home Antigen Test [facility] COVID-19 AG HOME TEST) KIT     [provider]  COVID-19 At Home Test [facility] COVID-19 AG HOME TEST VI) TEST AS DIRECTED TODAY Patient not taking: Reported on 12/22/2023    [provider]   etodolac (LODINE) 500 MG tablet Take 1 tablet twice a day by oral route with meals for 10 days. Patient not taking: Reported on 12/22/2023 08/29/20   [provider]  fluticasone  (FLONASE ) 50 MCG/ACT nasal spray Place 2 sprays into both nostrils daily. Patient not taking: Reported on 07/21/2016 06/21/16   Rose, Kayla, PA-C  ibuprofen  (ADVIL ,MOTRIN ) 800 MG tablet Take 1 tablet (800 mg total) by mouth 3 (three) times daily. Patient not taking: Reported on 07/21/2016 06/21/16   Rose, Kayla, PA-C  predniSONE  (DELTASONE ) 10 MG tablet TAKE 1 TABLET BY MOUTH THREE TIMES DAILY FOR 2 DAYS THEN TAKE 1 TABLET TWICE DAILY FOR 5 DAYS THEN TAKE 1 TABLET DAILY UNTIL ALL TAKEN Patient not taking: Reported on 12/22/2023    [provider]  predniSONE  (DELTASONE ) 50 MG tablet Take 1 pill daily for 5 days. Patient not taking: Reported on 07/21/2016 09/12/15   Diedra Rocky PARAS, MD    Family History Family History  Problem Relation Age of Onset   Hypertension Mother    Hypertension Father     Social History Social History   Tobacco Use   Smoking status: Light Smoker   Smokeless tobacco: Never  Vaping Use   Vaping status: Never Used  Substance Use Topics   Alcohol use: Yes  Comment: occasional   Drug use: No     Allergies   Codeine   Review of Systems Review of Systems  Constitutional:  Negative for chills and fever.  HENT:  Negative for congestion and ear pain.   Eyes:  Negative for discharge and redness.  Respiratory:  Positive for cough. Negative for shortness of breath and wheezing.   Gastrointestinal:  Negative for abdominal pain, diarrhea, nausea and vomiting.  Musculoskeletal:  Positive for back pain and myalgias.  Neurological:  Positive for headaches. Negative for numbness.     Physical Exam Triage Vital Signs ED Triage Vitals  Encounter Vitals Group     BP      Girls Systolic BP Percentile      Girls Diastolic BP Percentile      Boys Systolic BP Percentile       Boys Diastolic BP Percentile      Pulse      Resp      Temp      Temp src      SpO2      Weight      Height      Head Circumference      Peak Flow      Pain Score      Pain Loc      Pain Education      Exclude from Growth Chart    No data found.  Updated Vital Signs BP (!) 151/98 (BP Location: Left Arm)   Pulse 93   Temp 99 F (37.2 C) (Oral)   Resp 20   SpO2 95%   Visual Acuity Right Eye Distance:   Left Eye Distance:   Bilateral Distance:    Right Eye Near:   Left Eye Near:    Bilateral Near:     Physical Exam Vitals and nursing note reviewed.  Constitutional:      General: She is in acute distress.     Comments: Patient appears very uncomfortable, requires wheelchair for transport  HENT:     Head: Normocephalic and atraumatic.     Nose: Congestion present.     Mouth/Throat:     Mouth: Mucous membranes are moist.     Pharynx: No oropharyngeal exudate or posterior oropharyngeal erythema.  Eyes:     Conjunctiva/sclera: Conjunctivae normal.  Cardiovascular:     Rate and Rhythm: Normal rate and regular rhythm.     Heart sounds: Normal heart sounds. No murmur heard. Pulmonary:     Effort: Pulmonary effort is normal. No respiratory distress.     Breath sounds: Normal breath sounds. No wheezing, rhonchi or rales.  Skin:    General: Skin is warm and dry.  Neurological:     Mental Status: She is alert.  Psychiatric:        Mood and Affect: Mood normal.        Thought Content: Thought content normal.      UC Treatments / Results  Labs (all labs ordered are listed, but only abnormal results are displayed) Labs Reviewed  POC SARS CORONAVIRUS 2 AG -  ED - Normal    EKG   Radiology No results found.  Procedures Procedures (including critical care time)  Medications Ordered in UC Medications - No data to display  Initial Impression / Assessment and Plan / UC Course  I have reviewed the triage vital signs and the nursing notes.  Pertinent labs &  imaging results that were available during my care of the patient were reviewed by  me and considered in my medical decision making (see chart for details).    Given severe back pain with worsening leg pain advised further evaluation in the emergency room.  We did order COVID screening that was negative.  Patient is agreeable plan and daughter will transport via POV to ED.  Final Clinical Impressions(s) / UC Diagnoses   Final diagnoses:  Acute upper respiratory infection  Bilateral leg pain   Discharge Instructions   None    ED Prescriptions   None    PDMP not reviewed this encounter.   Billy Asberry FALCON, PA-C 12/22/23 [phone]

## 2023-12-22 NOTE — ED Notes (Addendum)
 Lac was 2.5

## 2023-12-22 NOTE — ED Provider Notes (Signed)
 Emergency Department Provider Note   I have reviewed the triage vital signs and the nursing notes.   HISTORY  Chief Complaint Generalized Body Aches and Leg Pain   HPI Patricia Pierce is a 58 y.o. female presents to the emergency department for evaluation of fatigue, chills, fever, URI symptoms.  Symptoms began yesterday.  Possible recent sick contacts but no clear diagnosis.  She denies any chest pain or shortness of breath.  No vomiting or diarrhea.  No abdominal pain.  She does have a headache.  Body aches became moderate to severe which prompted her ED evaluation.   Past Medical History:  Diagnosis Date   Hypoglycemia     Review of Systems  Constitutional: Positive fever/chills ENT: Positive sore throat. Cardiovascular: Denies chest pain. Respiratory: Denies shortness of breath. Gastrointestinal: No abdominal pain.  No nausea, no vomiting.   Musculoskeletal: Positive diffuse muscle aches.  Skin: Negative for rash. Neurological: Positive HA.   ____________________________________________   PHYSICAL EXAM:  VITAL SIGNS: ED Triage Vitals  Encounter Vitals Group     BP 12/22/23 1817 (!) 170/88     Pulse Rate 12/22/23 1817 (!) 107     Resp 12/22/23 1817 (!) 22     Temp 12/22/23 1817 99.2 F (37.3 C)     Temp src --      SpO2 12/22/23 1817 99 %   Constitutional: Alert and oriented. Well appearing and in no acute distress. Eyes: Conjunctivae are normal.  Head: Atraumatic. Nose: Positive congestion/rhinnorhea. Mouth/Throat: Mucous membranes are moist.  Oropharynx non-erythematous. Neck: No stridor.  Cardiovascular: Normal rate, regular rhythm. Good peripheral circulation. Grossly normal heart sounds.   Respiratory: Normal respiratory effort.  No retractions. Lungs CTAB. Gastrointestinal: Soft and nontender. No distention.  Musculoskeletal: No gross deformities of extremities. Neurologic:  Normal speech and language.  Skin:  Skin is warm, dry and intact. No  rash noted.  ____________________________________________   LABS (all labs ordered are listed, but only abnormal results are displayed)  Labs Reviewed  RESP PANEL BY RT-PCR (RSV, FLU A&B, COVID)  RVPGX2 - Abnormal; Notable for the following components:      Result Value   SARS Coronavirus 2 by RT PCR POSITIVE (*)    All other components within normal limits  COMPREHENSIVE METABOLIC PANEL WITH GFR - Abnormal; Notable for the following components:   Potassium 3.4 (*)    Total Protein 8.2 (*)    All other components within normal limits  CBC WITH DIFFERENTIAL/PLATELET - Abnormal; Notable for the following components:   Lymphs Abs 0.5 (*)    All other components within normal limits  URINALYSIS, W/ REFLEX TO CULTURE (INFECTION SUSPECTED) - Abnormal; Notable for the following components:   Hgb urine dipstick MODERATE (*)    Bacteria, UA RARE (*)    All other components within normal limits  I-STAT CG4 LACTIC ACID, ED  I-STAT CG4 LACTIC ACID, ED   ____________________________________________  RADIOLOGY  DG Chest 2 View Result Date: 12/22/2023 CLINICAL DATA:  Cough EXAM: CHEST - 2 VIEW COMPARISON:  07/20/2016 FINDINGS: The heart size and mediastinal contours are within normal limits. Both lungs are clear. The visualized skeletal structures are unremarkable. IMPRESSION: No active cardiopulmonary disease. Electronically Signed   By: Luke Bun M.D.   On: 12/22/2023 19:31    ____________________________________________   PROCEDURES  Procedure(s) performed:   Procedures  None  ____________________________________________   INITIAL IMPRESSION / ASSESSMENT AND PLAN / ED COURSE  Pertinent labs & imaging results that were available  during my care of the patient were reviewed by me and considered in my medical decision making (see chart for details).   This patient is Presenting for Evaluation of URI symptoms, which does require a range of treatment options, and is a complaint  that involves a high risk of morbidity and mortality.  The Differential Diagnoses includes COVID, flu, RSV, community-acquired pneumonia, etc.  Critical Interventions-    Medications  acetaminophen  (TYLENOL ) tablet 650 mg (650 mg Oral Given 12/22/23 1829)  sodium chloride  0.9 % bolus 1,000 mL (0 mLs Intravenous Stopped 12/22/23 2341)  ketorolac  (TORADOL ) 30 MG/ML injection 30 mg (30 mg Intravenous Given 12/22/23 2114)  acetaminophen  (TYLENOL ) tablet 650 mg (650 mg Oral Given 12/22/23 2343)    Reassessment after intervention: symptoms improved.    Clinical Laboratory Tests Ordered, included COVID PCR positive from waiting room.  CBC without leukocytosis.  No acute kidney injury.  UA without infection.  Lactic acid negative.  Radiologic Tests Ordered, included CXR. I independently interpreted the images and agree with radiology interpretation.   Cardiac Monitor Tracing which shows NSR.    Social Determinants of Health Risk patient is a smoker.   Medical Decision Making: Summary:  The patient presents the emergency department with upper respiratory infection symptoms.  She has tested positive for COVID here.  Patient would be a candidate for Paxlovid .  Discussed that she is interested.   Reevaluation with update and discussion with patient after IV fluids and Toradol .  She is feeling improved.  Will send antiviral meds to pharmacy.  Provided work note.  Discussed quarantine.   Patient's presentation is most consistent with acute, uncomplicated illness.   Disposition: discharge  ____________________________________________  FINAL CLINICAL IMPRESSION(S) / ED DIAGNOSES  Final diagnoses:  COVID-19     NEW OUTPATIENT MEDICATIONS STARTED DURING THIS VISIT:  Discharge Medication List as of 12/22/2023 11:36 PM     START taking these medications   Details  nirmatrelvir/ritonavir (PAXLOVID , 300/100,) 20 x 150 MG & 10 x 100MG  TBPK Take 3 tablets by mouth 2 (two) times daily for 5 days.  Patient GFR is 60. Take nirmatrelvir (150 mg) two tablets twice daily for 5 days and ritonavir (100 mg) one tablet twice daily for 5 days., Starting Thu 12/22/2023, Until Tue 12/27/2023, Normal        Note:  This document was prepared using Dragon voice recognition software and may include unintentional dictation errors.  Fonda Law, MD, [facility]    Latoia Eyster, Fonda MATSU, MD 12/23/23 [phone]

## 2023-12-22 NOTE — ED Triage Notes (Signed)
 Pt reports dry cough and headache that started yesterday with low grade fevers (99.0). Denies congestion or other symptoms. Reports severe back pain that radiates down her legs started earlier today. Unknown cause. Taken dayquil with no relief.
